# Patient Record
Sex: Male | Born: 1940 | Race: White | Hispanic: No | Marital: Married | Smoking: Former smoker
Health system: Southern US, Community
[De-identification: ages and names within clinical notes are randomized; demographics above are authoritative.]

## PROBLEM LIST (undated history)

## (undated) DIAGNOSIS — H538 Other visual disturbances: Secondary | ICD-10-CM

## (undated) DIAGNOSIS — L309 Dermatitis, unspecified: Secondary | ICD-10-CM

## (undated) DIAGNOSIS — M549 Dorsalgia, unspecified: Secondary | ICD-10-CM

## (undated) DIAGNOSIS — K5792 Diverticulitis of intestine, part unspecified, without perforation or abscess without bleeding: Secondary | ICD-10-CM

## (undated) DIAGNOSIS — F419 Anxiety disorder, unspecified: Secondary | ICD-10-CM

## (undated) DIAGNOSIS — N400 Enlarged prostate without lower urinary tract symptoms: Secondary | ICD-10-CM

## (undated) DIAGNOSIS — R55 Syncope and collapse: Secondary | ICD-10-CM

## (undated) DIAGNOSIS — M1990 Unspecified osteoarthritis, unspecified site: Secondary | ICD-10-CM

## (undated) DIAGNOSIS — E785 Hyperlipidemia, unspecified: Secondary | ICD-10-CM

## (undated) DIAGNOSIS — I8393 Asymptomatic varicose veins of bilateral lower extremities: Secondary | ICD-10-CM

## (undated) DIAGNOSIS — I10 Essential (primary) hypertension: Secondary | ICD-10-CM

## (undated) DIAGNOSIS — M79606 Pain in leg, unspecified: Secondary | ICD-10-CM

## (undated) DIAGNOSIS — M4846XA Fatigue fracture of vertebra, lumbar region, initial encounter for fracture: Secondary | ICD-10-CM

## (undated) DIAGNOSIS — R51 Headache: Secondary | ICD-10-CM

## (undated) DIAGNOSIS — G8929 Other chronic pain: Secondary | ICD-10-CM

## (undated) DIAGNOSIS — R519 Headache, unspecified: Secondary | ICD-10-CM

## (undated) DIAGNOSIS — F5105 Insomnia due to other mental disorder: Secondary | ICD-10-CM

## (undated) HISTORY — DX: Syncope and collapse: R55

## (undated) HISTORY — DX: Unspecified osteoarthritis, unspecified site: M19.90

## (undated) HISTORY — PX: OTHER SURGICAL HISTORY: SHX169

## (undated) HISTORY — DX: Diverticulitis of intestine, part unspecified, without perforation or abscess without bleeding: K57.92

## (undated) HISTORY — DX: Essential (primary) hypertension: I10

## (undated) HISTORY — DX: Pain in leg, unspecified: M79.606

## (undated) HISTORY — DX: Anxiety disorder, unspecified: F41.9

## (undated) HISTORY — DX: Other visual disturbances: H53.8

## (undated) HISTORY — DX: Headache: R51

## (undated) HISTORY — DX: Dermatitis, unspecified: L30.9

## (undated) HISTORY — DX: Benign prostatic hyperplasia without lower urinary tract symptoms: N40.0

## (undated) HISTORY — DX: Asymptomatic varicose veins of bilateral lower extremities: I83.93

## (undated) HISTORY — DX: Insomnia due to other mental disorder: F51.05

## (undated) HISTORY — DX: Hyperlipidemia, unspecified: E78.5

## (undated) HISTORY — DX: Headache, unspecified: R51.9

---

## 2002-07-31 ENCOUNTER — Encounter: Payer: Self-pay | Admitting: Family Medicine

## 2002-07-31 ENCOUNTER — Ambulatory Visit (HOSPITAL_COMMUNITY): Admission: RE | Admit: 2002-07-31 | Discharge: 2002-07-31 | Payer: Self-pay | Admitting: Family Medicine

## 2005-03-05 ENCOUNTER — Encounter: Admission: RE | Admit: 2005-03-05 | Discharge: 2005-03-05 | Payer: Self-pay | Admitting: Pulmonary Disease

## 2005-10-14 ENCOUNTER — Ambulatory Visit (HOSPITAL_COMMUNITY): Admission: RE | Admit: 2005-10-14 | Discharge: 2005-10-14 | Payer: Self-pay | Admitting: Pulmonary Disease

## 2007-03-29 ENCOUNTER — Ambulatory Visit (HOSPITAL_COMMUNITY): Admission: RE | Admit: 2007-03-29 | Discharge: 2007-03-29 | Payer: Self-pay | Admitting: Pulmonary Disease

## 2007-03-31 ENCOUNTER — Ambulatory Visit: Payer: Self-pay | Admitting: Internal Medicine

## 2008-01-03 ENCOUNTER — Ambulatory Visit (HOSPITAL_COMMUNITY): Admission: RE | Admit: 2008-01-03 | Discharge: 2008-01-03 | Payer: Self-pay | Admitting: Pulmonary Disease

## 2009-03-10 LAB — HM COLONOSCOPY

## 2010-12-06 ENCOUNTER — Encounter: Payer: Self-pay | Admitting: Family Medicine

## 2011-02-26 ENCOUNTER — Encounter: Payer: Self-pay | Admitting: Physician Assistant

## 2011-02-26 DIAGNOSIS — E119 Type 2 diabetes mellitus without complications: Secondary | ICD-10-CM

## 2011-02-26 DIAGNOSIS — G4700 Insomnia, unspecified: Secondary | ICD-10-CM

## 2011-02-26 DIAGNOSIS — I10 Essential (primary) hypertension: Secondary | ICD-10-CM

## 2011-02-26 DIAGNOSIS — F32A Depression, unspecified: Secondary | ICD-10-CM

## 2011-02-26 DIAGNOSIS — F329 Major depressive disorder, single episode, unspecified: Secondary | ICD-10-CM | POA: Insufficient documentation

## 2011-02-26 DIAGNOSIS — J309 Allergic rhinitis, unspecified: Secondary | ICD-10-CM

## 2011-02-26 DIAGNOSIS — N400 Enlarged prostate without lower urinary tract symptoms: Secondary | ICD-10-CM

## 2011-02-26 DIAGNOSIS — F419 Anxiety disorder, unspecified: Secondary | ICD-10-CM | POA: Insufficient documentation

## 2011-03-30 NOTE — H&P (Signed)
NAME:  Omar Porter, Omar Porter                 ACCOUNT NO.:  [id number]   MEDICAL RECORD NO.:  [id number]          PATIENT TYPE:  AMB   LOCATION:  DAY                           FACILITY:  [facility]   PHYSICIAN:  Lionel December, M.D.    DATE OF BIRTH:  [birth year]   DATE OF ADMISSION:  DATE OF DISCHARGE:  LH                              HISTORY & PHYSICAL   REASON FOR CONSULTATION:  Diarrhea.   HISTORY OF PRESENT ILLNESS:  Omar Porter is a 70 year old male who tells me  about 2-3 weeks ago he had a couple of day history of loose, watery  diarrhea, about four stools a day, along with some left lower quadrant  pain.  He tells me he has sporadic diarrhea, every 3-4 months he will  develop some intermittent abdominal pain and diarrhea.  Four to five  months ago, he was even awakened in the middle of the night.  Most of  his symptoms do appear to be nocturnal.  He denies any rectal bleeding  or melena.  Denies any fever, does have some chills.  Denies any nausea  or vomiting.  Weight has remained stable.  Denies any sensitivity to  milk products.  Denies any ill family contacts.  Denies any foreign  travel, new pets or new medications.  Denies any heartburn, indigestion,  dysphagia, odynophagia, anorexia or early satiety.   MEDICAL HISTORY:  Hypertension.  He has BPH and is followed by Dr.  Jerre Simon.  He has had diverticulitis on a couple of occasions, but tells  me he does not think he has had a complete colonoscopy.   CURRENT MEDICATIONS:  1. Metoprolol 50 mg b.i.d.  2. Doxazosin 4 mg daily.  3. Tylenol p.r.n.  4. Ibuprofen p.r.n.   ALLERGIES:  NO KNOWN DRUG ALLERGIES.   FAMILY HISTORY:  There is no known family history of colorectal  carcinoma or liver or chronic GI problems.   SOCIAL HISTORY:  Omar Porter has been married for 11 years.  He has two  grown healthy children.  He works at [employer] full time.  He denies  any tobacco, alcohol or drug use.   REVIEW OF SYSTEMS:  CONSTITUTIONAL:  Denies  any fatigue.  CARDIOVASCULAR:  Denies any chest pain or palpitations.  PULMONARY:  Denies shortness of breath, dyspnea, cough, hemoptysis.  GI: See HPI.   PHYSICAL EXAMINATION:  VITAL SIGNS:  Weight 219 pounds, height 71  inches, temperature 97.1, blood pressure 140/98 and pulse 56.  GENERAL:  Omar Porter is a well-developed, well-nourished Caucasian male in  no acute distress.  HEENT:  Clear, nonicteric.  Conjunctivae are pink.  Oropharynx pink and  moist without lesions.  NECK:  Supple without any mass or thyromegaly.  CHEST:  Heart regular rate and rhythm, normal S1, S2 without any  murmurs, clicks, rubs or gallops.  LUNGS:  Clear to auscultation bilaterally.  ABDOMEN:  Positive bowel sounds x4.  No bruits auscultated.  Soft,  nontender, nondistended without palpable mass or hepatosplenomegaly.  No  rebound or guarding.  EXTREMITIES:  Without clubbing or edema bilaterally.  SKIN:  Pink, warm and dry without rash, jaundice.   IMPRESSION:  Omar Porter is a 70 year old Caucasian male with intermittent  history of nocturnal diarrhea.  His symptoms are definitely concerning,  and he needs to have colonoscopy to rule out colorectal carcinoma and  also, not mentioned above,  he did have a CT of the abdomen and pelvis  with contrast on Mar 29, 2007.  He was found to have colonic  diverticulosis without evidence of diverticulitis and mental prostate  gland enlargement, few small pelvic phleboliths, nonobstructing left  renal calculi and a tiny cyst.  His symptoms are not typical for  inflammatory bowel disease, although, he could have an element of IBS.  Again, the nocturnal diarrhea is definitely concerning   PLAN:  Colonoscopy with Dr. Karilyn Cota in he near future.  I have discussed  the procedure including risks and benefits, which include but are not  limited to bleeding, infection, perforation, drug reaction.  He agrees  with plan and consent will be obtained.   We would like to thank Dr.  Juanetta Gosling for allowing [place] to participate in the  care of Omar Porter.      Nicholas Lose, N.P.      Lionel December, M.D.  Electronically Signed    KC/MEDQ  D:  03/31/2007  T:  03/31/2007  Job:  [id number]   cc:   Ramon Dredge L. Juanetta Gosling, M.D.  Fax: [phone]

## 2011-04-19 ENCOUNTER — Encounter: Payer: Self-pay | Admitting: Physician Assistant

## 2012-11-01 ENCOUNTER — Encounter: Payer: Self-pay | Admitting: Cardiology

## 2012-11-01 ENCOUNTER — Ambulatory Visit (INDEPENDENT_AMBULATORY_CARE_PROVIDER_SITE_OTHER): Payer: Medicare Other | Admitting: Cardiology

## 2012-11-01 VITALS — BP 140/80 | HR 101 | Ht 70.0 in | Wt 202.0 lb

## 2012-11-01 DIAGNOSIS — R609 Edema, unspecified: Secondary | ICD-10-CM

## 2012-11-01 DIAGNOSIS — I10 Essential (primary) hypertension: Secondary | ICD-10-CM

## 2012-11-01 MED ORDER — AMLODIPINE BESYLATE 5 MG PO TABS: 5.0000 mg | ORAL_TABLET | Freq: Every day | ORAL | Status: DC

## 2012-11-01 NOTE — Progress Notes (Signed)
HPI The patient presents for evaluation of edema. He has no prior cardiac history other than an ultrasound some years ago. He does have cardiovascular risk factors as described. He was noted recently to have lower extremity swelling. He has had this in the past but this was much worse than usual. Chest x-ray was normal. BNP was 22. He was treated with diuretics with improvement and referred here. His swelling is much improved. He is not describing any shortness of breath, PND or orthopnea. He is not describing any palpitations, presyncope or syncope. He has had some chest discomfort. However, this he has only after he eats. It lasts for about 15 minutes. Taking a drink helps. Moving around helps. Moving his arms in a certain direction helps.  He is somewhat limited by arthritis but with his usual activities he cannot bring this discomfort on. He describes at its peak as 5/10 in under his left breast without radiation.   No Known Allergies  Current Outpatient Prescriptions  Medication Sig Dispense Refill  . amLODipine (NORVASC) 5 MG tablet Take 1 tablet (5 mg total) by mouth daily.  30 tablet  11  . atorvastatin (LIPITOR) 80 MG tablet Take 80 mg by mouth daily.      . clotrimazole-betamethasone (LOTRISONE) cream Apply topically 2 (two) times daily.        . diclofenac sodium (VOLTAREN) 1 % GEL Apply topically 4 (four) times daily.      Marland Kitchen esomeprazole (NEXIUM) 40 MG capsule Take 40 mg by mouth daily before breakfast.      . furosemide (LASIX) 40 MG tablet Take 40 mg by mouth 2 (two) times daily.      Marland Kitchen glyBURIDE (DIABETA) 5 MG tablet Take 5 mg by mouth daily with breakfast.      . lisinopril (PRINIVIL,ZESTRIL) 20 MG tablet Take 20 mg by mouth 2 (two) times daily.        . montelukast (SINGULAIR) 10 MG tablet Take 10 mg by mouth at bedtime.        . sitaGLIPtan-metformin (JANUMET) 50-1000 MG per tablet Take 1 tablet by mouth 2 (two) times daily with a meal.      . terazosin (HYTRIN) 10 MG capsule  Take 10 mg by mouth 2 (two) times daily.        Marland Kitchen zolpidem (AMBIEN) 10 MG tablet Take 10 mg by mouth at bedtime as needed.          Past Medical History  Diagnosis Date  . Varicose veins of legs     right   . Blurred vision   . Diabetes mellitus   . Severe headache   . Syncopal episodes     with vision change   . Leg pain   . Hyperlipidemia   . Allergic rhinitis   . Arthritis   . Diverticulitis   . BPH (benign prostatic hyperplasia)   . Hypertension   . Hyposomnia, insomnia, or sleeplessness associated with anxiety   . Dermatitis     Past Surgical History  Procedure Date  . None     Family History  Problem Relation Age of Onset  . CAD Father     History   Social History  . Marital Status: Married    Spouse Name: N/A    Number of Children: 2  . Years of Education: N/A   Occupational History  . Not on file.   Social History Main Topics  . Smoking status: Former Smoker    Quit date:  11/01/1972  . Smokeless tobacco: Not on file  . Alcohol Use: Not on file  . Drug Use: Not on file  . Sexually Active: Not on file   Other Topics Concern  . Not on file   Social History Narrative   Lives at home with wife.      ROS:  Positive for joint pains in the legs and back, headaches and dizziness, dry cough, reflux.Marland Kitchentoher     PHYSICAL EXAM BP 140/80  Pulse 101  Ht 5\' 10"  (1.778 m)  Wt 202 lb (91.627 kg)  BMI 28.98 kg/m2 GENERAL:  Well appearing HEENT:  Pupils equal round and reactive, fundi not visualized, oral mucosa unremarkable, poor dentition NECK:  No jugular venous distention, waveform within normal limits, carotid upstroke brisk and symmetric, no bruits, no thyromegaly LYMPHATICS:  No cervical, inguinal adenopathy LUNGS:  Clear to auscultation bilaterally BACK:  No CVA tenderness CHEST:  Unremarkable HEART:  PMI not displaced or sustained,S1 and S2 within normal limits, no S3, no S4, no clicks, no rubs, no murmurs ABD:  Flat, positive bowel sounds  normal in frequency in pitch, no bruits, no rebound, no guarding, no midline pulsatile mass, no hepatomegaly, no splenomegaly EXT:  2 plus pulses throughout, moderate bilateral lower extremity edema, no cyanosis no clubbing SKIN:  No rashes no nodules NEURO:  Cranial nerves II through XII grossly intact, motor grossly intact throughout PSYCH:  Cognitively intact, oriented to person place and time   EKG:  Sinus tachycardia, rate 101, left axis deviation, low voltage in limb leads, poor anterior R wave progression, no acute ST-T wave changes. 11/01/2012  ASSESSMENT AND PLAN  EDEMA I suspect that this is to some degree related to his Norvasc and I will reduce the dose to 5 mg. He will continue current dose of diuretic. I will be checking an echocardiogram to further evaluate pulmonary pressures although his BNP was normal.  TACHYCARDIA I will start with the echocardiogram. Given his risk factors I would have a low threshold for stress testing. Unfortunately with joint problems he would not be able to walk on a treadmill.  HTN He will keep an eye on his blood pressure. If his pressure goes up with the reduced dose of Norvasc I will likely start spironolactone.

## 2012-11-01 NOTE — Patient Instructions (Addendum)
Your physician has requested that you have an echocardiogram. Echocardiography is a painless test that uses sound waves to create images of your heart. It provides your doctor with information about the size and shape of your heart and how well your heart's chambers and valves are working. This procedure takes approximately one hour. There are no restrictions for this procedure.  This will be done in the [facility] office on 11/16/12 at 12:00.  Your physician has recommended you make the following change in your medication: DECREASE Amlodipine to 5mg  daily  Your physician recommends that you schedule a follow-up appointment in: 1 MONTH with Dr Antoine Poche

## 2012-11-16 ENCOUNTER — Telehealth: Payer: Self-pay | Admitting: Cardiology

## 2012-11-16 ENCOUNTER — Other Ambulatory Visit: Payer: Self-pay

## 2012-11-16 DIAGNOSIS — R0989 Other specified symptoms and signs involving the circulatory and respiratory systems: Secondary | ICD-10-CM

## 2012-11-16 NOTE — Telephone Encounter (Signed)
Called and left message asking patient to call office regarding his NO Show for Echo.

## 2012-11-20 ENCOUNTER — Other Ambulatory Visit: Payer: Self-pay | Admitting: *Deleted

## 2012-11-20 DIAGNOSIS — I10 Essential (primary) hypertension: Secondary | ICD-10-CM

## 2012-11-20 DIAGNOSIS — R609 Edema, unspecified: Secondary | ICD-10-CM

## 2012-11-24 ENCOUNTER — Other Ambulatory Visit: Payer: Self-pay | Admitting: *Deleted

## 2012-11-24 DIAGNOSIS — R609 Edema, unspecified: Secondary | ICD-10-CM

## 2012-11-24 DIAGNOSIS — I10 Essential (primary) hypertension: Secondary | ICD-10-CM

## 2012-11-30 ENCOUNTER — Other Ambulatory Visit: Payer: Medicare Other

## 2012-11-30 ENCOUNTER — Other Ambulatory Visit: Payer: Self-pay

## 2012-11-30 ENCOUNTER — Other Ambulatory Visit (INDEPENDENT_AMBULATORY_CARE_PROVIDER_SITE_OTHER): Payer: Medicare Other

## 2012-11-30 DIAGNOSIS — I10 Essential (primary) hypertension: Secondary | ICD-10-CM

## 2012-11-30 DIAGNOSIS — R609 Edema, unspecified: Secondary | ICD-10-CM

## 2012-11-30 DIAGNOSIS — R072 Precordial pain: Secondary | ICD-10-CM

## 2012-12-06 ENCOUNTER — Encounter: Payer: Self-pay | Admitting: Cardiology

## 2012-12-06 ENCOUNTER — Ambulatory Visit (INDEPENDENT_AMBULATORY_CARE_PROVIDER_SITE_OTHER): Payer: 59 | Admitting: Cardiology

## 2012-12-06 ENCOUNTER — Telehealth: Payer: Self-pay | Admitting: *Deleted

## 2012-12-06 VITALS — BP 130/80 | HR 80 | Ht 69.0 in | Wt 208.0 lb

## 2012-12-06 DIAGNOSIS — R609 Edema, unspecified: Secondary | ICD-10-CM

## 2012-12-06 DIAGNOSIS — E119 Type 2 diabetes mellitus without complications: Secondary | ICD-10-CM

## 2012-12-06 DIAGNOSIS — I10 Essential (primary) hypertension: Secondary | ICD-10-CM

## 2012-12-06 MED ORDER — SPIRONOLACTONE 25 MG PO TABS: 25.0000 mg | ORAL_TABLET | Freq: Every day | ORAL | Status: DC

## 2012-12-06 NOTE — Telephone Encounter (Signed)
Patient informed. 

## 2012-12-06 NOTE — Patient Instructions (Addendum)
Please stop Norvasc (amlodipine) and start Spironolactone 25 mg a day. Continue all other medications as listed  Please have blood work at [facility]. (TSH and BMP).  Follow up with Dr Antoine Poche in 6 weeks.

## 2012-12-06 NOTE — Telephone Encounter (Signed)
Message copied by Eustace Moore on Wed Dec 06, 2012 10:03 AM ------      Message from: Rollene Rotunda      Created: Sun Dec 03, 2012  8:01 PM       Echo is OK.  No obvious etiology of his edema.  I would like to see him back in follow up to consider whether I want to order a stress test or not.  Call Mr. Maffett with the results.

## 2012-12-06 NOTE — Progress Notes (Signed)
HPI The patient presents for evaluation of edema. At the last appointment I thought this was likely related to Norvasc and I reduced it from 10-5 mg.  I did check an echocardiogram and there was no evidence of pulmonary hypertension. He did have some moderate concentric left ventricular hypertrophy however. His lower extremity edema is essentially unchanged from previous. He denies any new cardiovascular symptoms and continues to deny shortness of breath, PND or orthopnea. He does have a rapid resting heart rate. at that resting heart rate which he thinks he's always had. He doesn't feel any palpitations, presyncope or syncope. He has some atypical chest discomfort as previously described.   No Known Allergies  Current Outpatient Prescriptions  Medication Sig Dispense Refill  . ALPRAZolam (XANAX) 0.5 MG tablet       . amLODipine (NORVASC) 5 MG tablet Take 1 tablet (5 mg total) by mouth daily.  30 tablet  11  . atorvastatin (LIPITOR) 80 MG tablet Take 80 mg by mouth daily.      . clotrimazole-betamethasone (LOTRISONE) cream Apply topically 2 (two) times daily.        . diclofenac sodium (VOLTAREN) 1 % GEL Apply topically 4 (four) times daily.      Marland Kitchen esomeprazole (NEXIUM) 40 MG capsule Take 40 mg by mouth daily before breakfast.      . furosemide (LASIX) 40 MG tablet Take 40 mg by mouth 2 (two) times daily.      Marland Kitchen glimepiride (AMARYL) 4 MG tablet       . lisinopril (PRINIVIL,ZESTRIL) 20 MG tablet Take 20 mg by mouth 2 (two) times daily.        . montelukast (SINGULAIR) 10 MG tablet Take 10 mg by mouth at bedtime.        . sitaGLIPtan-metformin (JANUMET) 50-1000 MG per tablet Take 1 tablet by mouth 2 (two) times daily with a meal.      . terazosin (HYTRIN) 10 MG capsule Take 10 mg by mouth 2 (two) times daily.          Past Medical History  Diagnosis Date  . Varicose veins of legs     right   . Blurred vision   . Diabetes mellitus   . Severe headache   . Syncopal episodes     with  vision change   . Leg pain   . Hyperlipidemia   . Allergic rhinitis   . Arthritis   . Diverticulitis   . BPH (benign prostatic hyperplasia)   . Hypertension   . Hyposomnia, insomnia, or sleeplessness associated with anxiety   . Dermatitis     Past Surgical History  Procedure Date  . None     ROS:  As stated in the HPI and negative for all other systems.      PHYSICAL EXAM BP 130/80  Pulse 80  Ht 5\' 9"  (1.753 m)  Wt 208 lb (94.348 kg)  BMI 30.72 kg/m2 GENERAL:  Well appearing NECK:  No jugular venous distention, waveform within normal limits, carotid upstroke brisk and symmetric, no bruits, no thyromegaly LUNGS:  Clear to auscultation bilaterally BACK:  No CVA tenderness CHEST:  Unremarkable HEART:  PMI not displaced or sustained,S1 and S2 within normal limits, no S3, no S4, no clicks, no rubs, no murmurs ABD:  Flat, positive bowel sounds normal in frequency in pitch, no bruits, no rebound, no guarding, no midline pulsatile mass, no hepatomegaly, no splenomegaly EXT:  2 plus pulses throughout, moderate bilateral lower extremity edema, no  cyanosis no clubbing    EKG:  Sinus tachycardia, rate 101, left axis deviation, low voltage in limb leads, poor anterior R wave progression, no acute ST-T wave changes. 12/06/2012  ASSESSMENT AND PLAN  EDEMA I still suspect this might be related to some degree to the Norvasc. I will discontinue this completely. Management of his blood pressure will be as below.  TACHYCARDIA I looked again through his chart I didn't see a recent TSH. I will order this. We did discuss the possibility of this being related to PTSD been back to his time diffusing [place] in [place].  HTN I will spironolactone 25 mg daily and check a basic metabolic profile in 7 days.

## 2013-01-17 ENCOUNTER — Ambulatory Visit: Payer: Medicare Other | Admitting: Cardiology

## 2013-01-30 ENCOUNTER — Other Ambulatory Visit: Payer: Self-pay | Admitting: *Deleted

## 2013-01-30 MED ORDER — TERAZOSIN HCL 10 MG PO CAPS: 10.0000 mg | ORAL_CAPSULE | Freq: Two times a day (BID) | ORAL | Status: DC

## 2013-02-06 ENCOUNTER — Encounter: Payer: Self-pay | Admitting: Physician Assistant

## 2013-02-06 ENCOUNTER — Ambulatory Visit (INDEPENDENT_AMBULATORY_CARE_PROVIDER_SITE_OTHER): Payer: Medicare HMO | Admitting: Physician Assistant

## 2013-02-06 VITALS — BP 163/87 | HR 114 | Temp 96.4°F | Ht 69.0 in | Wt 204.4 lb

## 2013-02-06 DIAGNOSIS — F05 Delirium due to known physiological condition: Secondary | ICD-10-CM

## 2013-02-06 DIAGNOSIS — R972 Elevated prostate specific antigen [PSA]: Secondary | ICD-10-CM

## 2013-02-06 DIAGNOSIS — I10 Essential (primary) hypertension: Secondary | ICD-10-CM

## 2013-02-06 LAB — POCT URINALYSIS DIPSTICK
Bilirubin, UA: NEGATIVE
Blood, UA: NEGATIVE
Glucose, UA: NEGATIVE
Ketones, UA: NEGATIVE
Nitrite, UA: NEGATIVE
Protein, UA: NEGATIVE
Spec Grav, UA: 1.015
Urobilinogen, UA: NEGATIVE

## 2013-02-06 LAB — COMPREHENSIVE METABOLIC PANEL
ALT: 24 U/L (ref 0–53)
AST: 18 U/L (ref 0–37)
Albumin: 4.2 g/dL (ref 3.5–5.2)
Alkaline Phosphatase: 105 U/L (ref 39–117)
BUN: 8 mg/dL (ref 6–23)
CO2: 22 mEq/L (ref 19–32)
Calcium: 9.5 mg/dL (ref 8.4–10.5)
Glucose, Bld: 191 mg/dL — ABNORMAL HIGH (ref 70–99)
Potassium: 3.6 mEq/L (ref 3.5–5.3)
Sodium: 141 mEq/L (ref 135–145)
Total Bilirubin: 0.5 mg/dL (ref 0.3–1.2)
Total Protein: 6.5 g/dL (ref 6.0–8.3)

## 2013-02-06 LAB — POCT CBC
Granulocyte percent: 75.2 %G (ref 37–80)
HCT, POC: 50.6 % (ref 43.5–53.7)
Lymph, poc: 2.3 (ref 0.6–3.4)
MCH, POC: 29.1 pg (ref 27–31.2)
MPV: 7.5 fL (ref 0–99.8)
POC Granulocyte: 7.6 — AB (ref 2–6.9)
POC LYMPH PERCENT: 23.1 %L (ref 10–50)
Platelet Count, POC: 247 10*3/uL (ref 142–424)
RBC: 5.7 M/uL (ref 4.69–6.13)
RDW, POC: 13.6 %
WBC: 10.1 10*3/uL (ref 4.6–10.2)

## 2013-02-06 MED ORDER — METOPROLOL TARTRATE 25 MG PO TABS: 25.0000 mg | ORAL_TABLET | Freq: Two times a day (BID) | ORAL | Status: DC

## 2013-02-06 NOTE — Progress Notes (Signed)
  Subjective:    Patient ID: Omar Porter, male    DOB: 10-26-1941, 72 y.o.   MRN: [id number]  HPI Accompanied by wife; having episodes of elevated B/P and confusion; generally occurring in late evening or early morning    Review of Systems  All other systems reviewed and are negative.       Objective:   Physical Exam  Constitutional: He is oriented to person, place, and time.  HENT:  Head: Normocephalic and atraumatic.  Right Ear: External ear normal.  Left Ear: External ear normal.  Nose: Nose normal.  Mouth/Throat: Oropharynx is clear and moist.  Poor dentition  Eyes: Conjunctivae and EOM are normal. Pupils are equal, round, and reactive to light.  Neck: Normal range of motion. Neck supple.  Cardiovascular: Normal heart sounds.  Exam reveals no gallop.   No murmur heard. Pulmonary/Chest: Effort normal and breath sounds normal.  Abdominal: Soft. Bowel sounds are normal.  Musculoskeletal:  Ambulates with cane  Neurological: He is alert and oriented to person, place, and time.  Skin: Skin is warm and dry.  Psychiatric: He has a normal mood and affect. His behavior is normal. Judgment and thought content normal.          Assessment & Plan:  Elevated B/P Confusion Orders Placed This Encounter  Procedures  . Comprehensive metabolic panel  . Ambulatory referral to Urology    Referral Priority:  Routine    Referral Type:  Consultation    Referral Reason:  Specialty Services Required    Requested Specialty:  Urology    Number of Visits Requested:  1  . POCT CBC  . POCT urinalysis dipstick   Meds ordered this encounter  Medications  . HYDROcodone-acetaminophen (NORCO/VICODIN) 5-325 MG per tablet    Sig: Take 5-325 tablets by mouth.  . metoprolol tartrate (LOPRESSOR) 25 MG tablet    Sig: Take 1 tablet (25 mg total) by mouth 2 (two) times daily.    Dispense:  60 tablet    Refill:  3    Order Specific Question:  Supervising Provider    Answer:  Ernestina Penna  [phone]

## 2013-02-08 ENCOUNTER — Other Ambulatory Visit: Payer: Self-pay

## 2013-02-08 MED ORDER — ATORVASTATIN CALCIUM 40 MG PO TABS: 40.0000 mg | ORAL_TABLET | Freq: Every day | ORAL | Status: DC

## 2013-02-12 ENCOUNTER — Other Ambulatory Visit: Payer: Self-pay | Admitting: *Deleted

## 2013-02-12 ENCOUNTER — Other Ambulatory Visit: Payer: Self-pay

## 2013-02-12 MED ORDER — TERAZOSIN HCL 10 MG PO CAPS: 10.0000 mg | ORAL_CAPSULE | Freq: Two times a day (BID) | ORAL | Status: DC

## 2013-02-12 MED ORDER — CLOTRIMAZOLE-BETAMETHASONE 1-0.05 % EX CREA: TOPICAL_CREAM | Freq: Two times a day (BID) | CUTANEOUS | Status: DC

## 2013-02-12 NOTE — Telephone Encounter (Signed)
Pt aware RX for Hytrin ready for him here at office.

## 2013-02-14 ENCOUNTER — Ambulatory Visit (INDEPENDENT_AMBULATORY_CARE_PROVIDER_SITE_OTHER): Payer: 59 | Admitting: Cardiology

## 2013-02-14 ENCOUNTER — Encounter: Payer: Self-pay | Admitting: Cardiology

## 2013-02-14 VITALS — BP 160/91 | HR 70 | Wt 201.0 lb

## 2013-02-14 DIAGNOSIS — I10 Essential (primary) hypertension: Secondary | ICD-10-CM

## 2013-02-14 DIAGNOSIS — E119 Type 2 diabetes mellitus without complications: Secondary | ICD-10-CM

## 2013-02-14 MED ORDER — AMLODIPINE BESYLATE 10 MG PO TABS: 10.0000 mg | ORAL_TABLET | Freq: Every day | ORAL | Status: AC

## 2013-02-14 NOTE — Patient Instructions (Addendum)
Please stop your spironolactone  Increase Amlodipine to 10 mg a day. Continue all other medications as listed  Follow up in one month with Dr Antoine Poche

## 2013-02-14 NOTE — Progress Notes (Signed)
HPI The patient presents for evaluation of edema.  I thought this was likely related to Norvasc and I reduced this from 10-5 mg in the past.  There was no change in the edema so I stopped it completely at the last visit.  I did check an echocardiogram and there was no evidence of pulmonary hypertension. He did have some moderate concentric left ventricular hypertrophy however.  His lower extremity edema is essentially unchanged from previous. However, his blood pressure is more poorly controlled off of Norvasc despite the addition of spironolactone. He denies any new cardiovascular symptoms otherwise.The patient denies any new symptoms such as chest discomfort, neck or arm discomfort. There has been no new shortness of breath, PND or orthopnea. There have been no reported palpitations, presyncope or syncope.  He does keep his feet down and has not been wearing compression stockings.  Allergies  Allergen Reactions  . Sulfa Antibiotics Diarrhea    Current Outpatient Prescriptions  Medication Sig Dispense Refill  . ALPRAZolam (XANAX) 0.5 MG tablet       . amLODipine (NORVASC) 5 MG tablet Take 1 tablet by mouth daily.      Marland Kitchen atorvastatin (LIPITOR) 40 MG tablet Take 1 tablet (40 mg total) by mouth daily.  30 tablet  4  . clotrimazole-betamethasone (LOTRISONE) cream Apply topically 2 (two) times daily.  30 g  1  . Desoximetasone (TOPICORT) 0.25 % ointment       . diclofenac sodium (VOLTAREN) 1 % GEL Apply topically 4 (four) times daily.      Marland Kitchen esomeprazole (NEXIUM) 40 MG capsule Take 40 mg by mouth daily before breakfast.      . furosemide (LASIX) 40 MG tablet Take 40 mg by mouth 2 (two) times daily.      Marland Kitchen glimepiride (AMARYL) 4 MG tablet       . glyBURIDE (DIABETA) 5 MG tablet Take 1 tablet by mouth daily.      Marland Kitchen HYDROcodone-acetaminophen (NORCO/VICODIN) 5-325 MG per tablet Take 5-325 tablets by mouth.      Marland Kitchen lisinopril (PRINIVIL,ZESTRIL) 20 MG tablet Take 20 mg by mouth 2 (two) times daily.         . metoprolol tartrate (LOPRESSOR) 25 MG tablet Take 1 tablet (25 mg total) by mouth 2 (two) times daily.  60 tablet  3  . montelukast (SINGULAIR) 10 MG tablet Take 10 mg by mouth at bedtime.        . sitaGLIPtan-metformin (JANUMET) 50-1000 MG per tablet Take 1 tablet by mouth 2 (two) times daily with a meal.      . spironolactone (ALDACTONE) 25 MG tablet Take 1 tablet (25 mg total) by mouth daily.  30 tablet  11  . terazosin (HYTRIN) 10 MG capsule Take 1 capsule (10 mg total) by mouth 2 (two) times daily.  60 capsule  4  . zolpidem (AMBIEN) 10 MG tablet Take 10 mg by mouth at bedtime as needed for sleep.       No current facility-administered medications for this visit.    Past Medical History  Diagnosis Date  . Varicose veins of legs     right   . Blurred vision   . Diabetes mellitus   . Severe headache   . Syncopal episodes     with vision change   . Leg pain   . Hyperlipidemia   . Allergic rhinitis   . Arthritis   . Diverticulitis   . BPH (benign prostatic hyperplasia)   . Hypertension   .  Hyposomnia, insomnia, or sleeplessness associated with anxiety   . Dermatitis     Past Surgical History  Procedure Laterality Date  . None      ROS:  As stated in the HPI and negative for all other systems.  PHYSICAL EXAM BP 160/91  Pulse 70  Wt 201 lb (91.173 kg)  BMI 29.67 kg/m2 GENERAL:  Well appearing NECK:  No jugular venous distention, waveform within normal limits, carotid upstroke brisk and symmetric, no bruits, no thyromegaly LUNGS:  Clear to auscultation bilaterally BACK:  No CVA tenderness CHEST:  Unremarkable HEART:  PMI not displaced or sustained,S1 and S2 within normal limits, no S3, no S4, no clicks, no rubs, no murmurs ABD:  Flat, positive bowel sounds normal in frequency in pitch, no bruits, no rebound, no guarding, no midline pulsatile mass, no hepatomegaly, no splenomegaly EXT:  2 plus pulses throughout, moderate bilateral lower extremity edema, no  cyanosis no clubbing  EKG:  Sinus tachycardia, rate 70, left axis deviation, low voltage in limb leads, poor anterior R wave progression, no acute ST-T wave changes. 02/14/2013  ASSESSMENT AND PLAN  EDEMA It did not get better off of the Norvasc.  He will start to keep the feet elevated and start to wear the compression stockings.   TACHYCARDIA This is improved on beta blocker added recently so he will continue this.   HTN The blood pressure is still not well controlled. Since his edema is not better and his blood pressure was controlled on Norvasc. I will stop the spironolactone and reinitiate 10 mg of Norvasc.

## 2013-02-19 ENCOUNTER — Other Ambulatory Visit: Payer: Self-pay | Admitting: Pharmacist

## 2013-02-19 DIAGNOSIS — E119 Type 2 diabetes mellitus without complications: Secondary | ICD-10-CM

## 2013-02-19 MED ORDER — ACCU-CHEK NANO SMARTVIEW W/DEVICE KIT: 1.0000 | PACK | Status: DC

## 2013-02-19 MED ORDER — ACCU-CHEK FASTCLIX LANCETS MISC: 1.0000 | Freq: Two times a day (BID) | Status: DC

## 2013-02-19 MED ORDER — GLUCOSE BLOOD VI STRP: ORAL_STRIP | Status: DC

## 2013-02-26 ENCOUNTER — Other Ambulatory Visit: Payer: Self-pay | Admitting: Family Medicine

## 2013-02-27 ENCOUNTER — Other Ambulatory Visit: Payer: Self-pay | Admitting: Family Medicine

## 2013-03-01 ENCOUNTER — Telehealth: Payer: Self-pay | Admitting: Family Medicine

## 2013-03-01 NOTE — Telephone Encounter (Signed)
PT WAS OFFERED APPT FOR 4/25 WITH [facility] AND [facility] AND REFUSED BOTH

## 2013-03-08 ENCOUNTER — Other Ambulatory Visit: Payer: Self-pay | Admitting: *Deleted

## 2013-03-08 MED ORDER — GLIMEPIRIDE 4 MG PO TABS: ORAL_TABLET | ORAL | Status: DC

## 2013-03-08 MED ORDER — CLOTRIMAZOLE-BETAMETHASONE 1-0.05 % EX CREA: TOPICAL_CREAM | Freq: Two times a day (BID) | CUTANEOUS | Status: DC

## 2013-03-08 NOTE — Telephone Encounter (Signed)
LAST REFILL 01/13/13. LAST OV 01/12/13. LAST AIC 12/19/12 7.0. SEE ALLERGIES OF SULFA TO GLIMEPIRIDE

## 2013-03-13 ENCOUNTER — Ambulatory Visit (INDEPENDENT_AMBULATORY_CARE_PROVIDER_SITE_OTHER): Payer: 59 | Admitting: Urology

## 2013-03-13 DIAGNOSIS — N400 Enlarged prostate without lower urinary tract symptoms: Secondary | ICD-10-CM

## 2013-03-13 DIAGNOSIS — R972 Elevated prostate specific antigen [PSA]: Secondary | ICD-10-CM

## 2013-03-14 ENCOUNTER — Encounter: Payer: Self-pay | Admitting: Cardiology

## 2013-03-14 ENCOUNTER — Ambulatory Visit (INDEPENDENT_AMBULATORY_CARE_PROVIDER_SITE_OTHER): Payer: 59 | Admitting: Cardiology

## 2013-03-14 VITALS — BP 100/67 | HR 73 | Ht 70.0 in | Wt 201.0 lb

## 2013-03-14 DIAGNOSIS — I10 Essential (primary) hypertension: Secondary | ICD-10-CM

## 2013-03-14 DIAGNOSIS — R609 Edema, unspecified: Secondary | ICD-10-CM

## 2013-03-14 NOTE — Patient Instructions (Addendum)
The current medical regimen is effective;  continue present plan and medications.  Follow up in 6 months with Dr Hochrein.  You will receive a letter in the mail 2 months before you are due.  Please call us when you receive this letter to schedule your follow up appointment.  

## 2013-03-14 NOTE — Progress Notes (Signed)
HPI The patient presents for evaluation of edema.  I thought this was likely related to Norvasc and I reduced this from 10-5 mg in the past.  There was no change in the edema so I stopped it completely.  However, this made no difference. He was not doing well with the addition of spironolactone. He did have improvement in his blood pressure and a decrease in heart rate with beta blocker. At the last visit I increased his Norvasc back to 10 mg. I did check an echocardiogram and there was no evidence of pulmonary hypertension. He did have some moderate concentric left ventricular hypertrophy however.  Since I last saw him he has been keeping his feet elevated more.  He has kept an excellent blood pressure diary.  He's had much less feet swelling. His blood pressures have been under much better control though slightly elevated in the mornings. He overall feels better. He did not want to wear compression stockings. He is having no new shortness of breath, PND or orthopnea.  Allergies  Allergen Reactions  . Sulfa Antibiotics Diarrhea    Current Outpatient Prescriptions  Medication Sig Dispense Refill  . ACCU-CHEK FASTCLIX LANCETS MISC 1 each by Does not apply route 2 (two) times daily. Use to check BG up to twice daily   Dx:  250.00  200 each  1  . amLODipine (NORVASC) 10 MG tablet Take 1 tablet (10 mg total) by mouth daily.  30 tablet  11  . atorvastatin (LIPITOR) 40 MG tablet Take 1 tablet (40 mg total) by mouth daily.  30 tablet  4  . Blood Glucose Monitoring Suppl (ACCU-CHEK NANO SMARTVIEW) W/DEVICE KIT 1 kit by Does not apply route See admin instructions. Use to check BG up to twice daily Dx: 250.00  1 kit  0  . clotrimazole-betamethasone (LOTRISONE) cream Apply topically 2 (two) times daily.  30 g  1  . Desoximetasone (TOPICORT) 0.25 % ointment       . diclofenac sodium (VOLTAREN) 1 % GEL Apply topically 4 (four) times daily.      . furosemide (LASIX) 40 MG tablet Take 40 mg by mouth 2  (two) times daily.      Marland Kitchen glimepiride (AMARYL) 4 MG tablet TAKE 1 TABLET BY MOUTH TWICE DAILY  60 tablet  1  . glucose blood (ACCU-CHEK SMARTVIEW) test strip Use to check BG up to twice daily  DX:  250.00  200 each  1  . glyBURIDE (DIABETA) 5 MG tablet Take 1 tablet by mouth daily.      Marland Kitchen lisinopril (PRINIVIL,ZESTRIL) 20 MG tablet Take 20 mg by mouth 2 (two) times daily.        . metoprolol tartrate (LOPRESSOR) 25 MG tablet Take 1 tablet (25 mg total) by mouth 2 (two) times daily.  60 tablet  3  . montelukast (SINGULAIR) 10 MG tablet TAKE 1 TABLET BY MOUTH AT BEDTIME  30 tablet  3  . sitaGLIPtan-metformin (JANUMET) 50-1000 MG per tablet Take 1 tablet by mouth 2 (two) times daily with a meal.      . terazosin (HYTRIN) 10 MG capsule Take 1 capsule (10 mg total) by mouth 2 (two) times daily.  60 capsule  4  . zolpidem (AMBIEN) 10 MG tablet Take 10 mg by mouth at bedtime as needed for sleep.       No current facility-administered medications for this visit.    Past Medical History  Diagnosis Date  . Varicose veins of legs  right   . Blurred vision   . Diabetes mellitus   . Severe headache   . Syncopal episodes     with vision change   . Leg pain   . Hyperlipidemia   . Allergic rhinitis   . Arthritis   . Diverticulitis   . BPH (benign prostatic hyperplasia)   . Hypertension   . Hyposomnia, insomnia, or sleeplessness associated with anxiety   . Dermatitis     Past Surgical History  Procedure Laterality Date  . None      ROS:  As stated in the HPI and negative for all other systems.  PHYSICAL EXAM BP 100/67  Pulse 73  Ht 5\' 10"  (1.778 m)  Wt 201 lb (91.173 kg)  BMI 28.84 kg/m2 GENERAL:  Well appearing NECK:  No jugular venous distention, waveform within normal limits, carotid upstroke brisk and symmetric, no bruits, no thyromegaly LUNGS:  Clear to auscultation bilaterally BACK:  No CVA tenderness CHEST:  Unremarkable HEART:  PMI not displaced or sustained,S1 and S2  within normal limits, no S3, no S4, no clicks, no rubs, no murmurs ABD:  Flat, positive bowel sounds normal in frequency in pitch, no bruits, no rebound, no guarding, no midline pulsatile mass, no hepatomegaly, no splenomegaly EXT:  2 plus pulses throughout, moderate bilateral lower extremity edema, no cyanosis no clubbing  EKG:  Sinus tachycardia, rate 70, left axis deviation, low voltage in limb leads, poor anterior R wave progression, no acute ST-T wave changes. 03/14/2013  ASSESSMENT AND PLAN  EDEMA This is much  Improved on the current medical regimen and with keeping his feet elevated.no change in therapy is indicated.  TACHYCARDIA This is improved on beta blocker added recently so he will continue this.   HTN The blood pressure is  better controlled. He will continue on the meds as listed.

## 2013-03-26 ENCOUNTER — Other Ambulatory Visit: Payer: Self-pay | Admitting: Nurse Practitioner

## 2013-03-27 ENCOUNTER — Other Ambulatory Visit: Payer: Self-pay | Admitting: *Deleted

## 2013-03-27 NOTE — Telephone Encounter (Signed)
Patient last seen in office on 02-06-13. Rx last filled on 02-10-13 for #30. Please advise. If approved please have nurse call in to [facility]

## 2013-04-09 ENCOUNTER — Other Ambulatory Visit: Payer: Self-pay | Admitting: Nurse Practitioner

## 2013-04-11 NOTE — Telephone Encounter (Signed)
Last filled on 01-13-13 for #30. Please advise. If approved please have nurse phone in to [facility]. Thank you

## 2013-04-26 ENCOUNTER — Ambulatory Visit (INDEPENDENT_AMBULATORY_CARE_PROVIDER_SITE_OTHER): Payer: Medicare HMO | Admitting: Nurse Practitioner

## 2013-04-26 ENCOUNTER — Telehealth: Payer: Self-pay | Admitting: Physician Assistant

## 2013-04-26 ENCOUNTER — Ambulatory Visit (INDEPENDENT_AMBULATORY_CARE_PROVIDER_SITE_OTHER): Payer: Medicare HMO

## 2013-04-26 ENCOUNTER — Encounter: Payer: Self-pay | Admitting: Nurse Practitioner

## 2013-04-26 VITALS — BP 142/78 | HR 81 | Temp 96.5°F | Ht 69.5 in | Wt 200.0 lb

## 2013-04-26 DIAGNOSIS — R0781 Pleurodynia: Secondary | ICD-10-CM

## 2013-04-26 DIAGNOSIS — IMO0002 Reserved for concepts with insufficient information to code with codable children: Secondary | ICD-10-CM

## 2013-04-26 DIAGNOSIS — R079 Chest pain, unspecified: Secondary | ICD-10-CM

## 2013-04-26 DIAGNOSIS — S29011A Strain of muscle and tendon of front wall of thorax, initial encounter: Secondary | ICD-10-CM

## 2013-04-26 MED ORDER — CYCLOBENZAPRINE HCL 5 MG PO TABS: 5.0000 mg | ORAL_TABLET | Freq: Three times a day (TID) | ORAL | Status: AC | PRN

## 2013-04-26 NOTE — Progress Notes (Signed)
  Subjective:    Patient ID: Omar Porter, male    DOB: 1941-01-15, 72 y.o.   MRN: [id number]  HPI Patient in c/o flank pain- has been going to chiropractor for back pain- he manipulated him yesterday and about 20 minutes later he started having right flank pain- very painful to move. Rates 4/10 right now- Nothing helps the pain other han sitting perfectly still. Called EMS to house last night and "EKG was normal".    Review of Systems  Respiratory: Negative for chest tightness and shortness of breath.   Cardiovascular: Negative.   Gastrointestinal: Negative.   Genitourinary: Negative.   Neurological: Negative.   Hematological: Negative.        Objective:   Physical Exam  Constitutional: He appears well-developed and well-nourished.  Cardiovascular: Normal rate and normal heart sounds.   Pulmonary/Chest: Effort normal and breath sounds normal.  Pain on palpation right lower rib cage anteriorly- no echymosis- no rash    BP 142/78  Pulse 81  Temp(Src) 96.5 F (35.8 C) (Oral)  Ht 5' 9.5" (1.765 m)  Wt 200 lb (90.719 kg)  BMI 29.12 kg/m2 Chest x ray- Negative rib fracture-Preliminary reading by Paulene Floor, FNP  [facility]       Assessment & Plan:   1. Strain of chest wall, initial encounter   2. Rib pain on right side    Orders Placed This Encounter  Procedures  . DG Chest 2 View    Standing Status: Future     Number of Occurrences: 1     Standing Expiration Date: 06/26/2014    Order Specific Question:  Reason for Exam (SYMPTOM  OR DIAGNOSIS REQUIRED)    Answer:  right ant lower rib cage pain    Order Specific Question:  Preferred imaging location?    Answer:  Internal  Flexeril 5mg  1 po tid prn #30 0 refill Moist heat  Rest Cough and deep breathe q 2 hrs rto PRN  Mary-Margaret Daphine Deutscher, FNP

## 2013-04-26 NOTE — Patient Instructions (Signed)
Muscle Strain  Muscle strain occurs when a muscle is stretched beyond its normal length. A small number of muscle fibers generally are torn. This is especially common in athletes. This happens when a sudden, violent force placed on a muscle stretches it too far. Usually, recovery from muscle strain takes 1 to 2 weeks. Complete healing will take 5 to 6 weeks.   HOME CARE INSTRUCTIONS    While awake, apply ice to the sore muscle for the first 2 days after the injury.   Put ice in a plastic bag.   Place a towel between your skin and the bag.   Leave the ice on for 15-20 minutes each hour.   Do not use the strained muscle for several days, until you no longer have pain.   You may wrap the injured area with an elastic bandage for comfort. Be careful not to wrap it too tightly. This may interfere with blood circulation or increase swelling.   Only take over-the-counter or prescription medicines for pain, discomfort, or fever as directed by your caregiver.  SEEK MEDICAL CARE IF:   You have increasing pain or swelling in the injured area.  MAKE SURE YOU:    Understand these instructions.   Will watch your condition.   Will get help right away if you are not doing well or get worse.  Document Released: 11/01/2005 Document Revised: 01/24/2012 Document Reviewed: 11/13/2011  [employer] Patient Information 2014 [employer].

## 2013-05-09 ENCOUNTER — Emergency Department (HOSPITAL_COMMUNITY): Payer: Medicare HMO

## 2013-05-09 ENCOUNTER — Emergency Department (HOSPITAL_COMMUNITY)
Admission: EM | Admit: 2013-05-09 | Discharge: 2013-05-09 | Disposition: A | Discharge status: Home / Self Care | Payer: Medicare HMO | Attending: Emergency Medicine | Admitting: Emergency Medicine

## 2013-05-09 ENCOUNTER — Encounter (HOSPITAL_COMMUNITY): Payer: Self-pay | Admitting: *Deleted

## 2013-05-09 DIAGNOSIS — Y9389 Activity, other specified: Secondary | ICD-10-CM | POA: Insufficient documentation

## 2013-05-09 DIAGNOSIS — Z87891 Personal history of nicotine dependence: Secondary | ICD-10-CM | POA: Insufficient documentation

## 2013-05-09 DIAGNOSIS — S32009A Unspecified fracture of unspecified lumbar vertebra, initial encounter for closed fracture: Secondary | ICD-10-CM | POA: Insufficient documentation

## 2013-05-09 DIAGNOSIS — E785 Hyperlipidemia, unspecified: Secondary | ICD-10-CM | POA: Insufficient documentation

## 2013-05-09 DIAGNOSIS — Z8709 Personal history of other diseases of the respiratory system: Secondary | ICD-10-CM | POA: Insufficient documentation

## 2013-05-09 DIAGNOSIS — X58XXXA Exposure to other specified factors, initial encounter: Secondary | ICD-10-CM | POA: Insufficient documentation

## 2013-05-09 DIAGNOSIS — H538 Other visual disturbances: Secondary | ICD-10-CM | POA: Insufficient documentation

## 2013-05-09 DIAGNOSIS — K5732 Diverticulitis of large intestine without perforation or abscess without bleeding: Secondary | ICD-10-CM | POA: Insufficient documentation

## 2013-05-09 DIAGNOSIS — S32010A Wedge compression fracture of first lumbar vertebra, initial encounter for closed fracture: Secondary | ICD-10-CM

## 2013-05-09 DIAGNOSIS — F5105 Insomnia due to other mental disorder: Secondary | ICD-10-CM | POA: Insufficient documentation

## 2013-05-09 DIAGNOSIS — M129 Arthropathy, unspecified: Secondary | ICD-10-CM | POA: Insufficient documentation

## 2013-05-09 DIAGNOSIS — Z87448 Personal history of other diseases of urinary system: Secondary | ICD-10-CM | POA: Insufficient documentation

## 2013-05-09 DIAGNOSIS — Z791 Long term (current) use of non-steroidal anti-inflammatories (NSAID): Secondary | ICD-10-CM | POA: Insufficient documentation

## 2013-05-09 DIAGNOSIS — I10 Essential (primary) hypertension: Secondary | ICD-10-CM | POA: Insufficient documentation

## 2013-05-09 DIAGNOSIS — Z872 Personal history of diseases of the skin and subcutaneous tissue: Secondary | ICD-10-CM | POA: Insufficient documentation

## 2013-05-09 DIAGNOSIS — Z8679 Personal history of other diseases of the circulatory system: Secondary | ICD-10-CM | POA: Insufficient documentation

## 2013-05-09 DIAGNOSIS — F489 Nonpsychotic mental disorder, unspecified: Secondary | ICD-10-CM | POA: Insufficient documentation

## 2013-05-09 DIAGNOSIS — Z9889 Other specified postprocedural states: Secondary | ICD-10-CM | POA: Insufficient documentation

## 2013-05-09 DIAGNOSIS — E119 Type 2 diabetes mellitus without complications: Secondary | ICD-10-CM | POA: Insufficient documentation

## 2013-05-09 DIAGNOSIS — F064 Anxiety disorder due to known physiological condition: Secondary | ICD-10-CM | POA: Insufficient documentation

## 2013-05-09 DIAGNOSIS — Y929 Unspecified place or not applicable: Secondary | ICD-10-CM | POA: Insufficient documentation

## 2013-05-09 DIAGNOSIS — Z79899 Other long term (current) drug therapy: Secondary | ICD-10-CM | POA: Insufficient documentation

## 2013-05-09 MED ORDER — OXYCODONE-ACETAMINOPHEN 5-325 MG PO TABS
1.0000 | ORAL_TABLET | Freq: Once | ORAL | Status: AC
  Administered 2013-05-09: 1 via ORAL
  Filled 2013-05-09: qty 1

## 2013-05-09 MED ORDER — POLYETHYLENE GLYCOL 3350 17 GM/SCOOP PO POWD: 17.0000 g | Freq: Two times a day (BID) | ORAL | Status: DC

## 2013-05-09 MED ORDER — OXYCODONE-ACETAMINOPHEN 5-325 MG PO TABS: 1.0000 | ORAL_TABLET | ORAL | Status: DC | PRN

## 2013-05-09 NOTE — ED Provider Notes (Signed)
History    This chart was scribed for Osvaldo Human, MD by Quintella Reichert, ED scribe.  This patient was seen in room APA18/APA18 and the patient's care was started at 4:59 PM.   CSN: [id number]  Arrival date & time 05/09/13  1639    Chief Complaint  Patient presents with  . Back Pain    The history is provided by the patient. No language interpreter was used.    HPI Comments: Omar Porter is a 72 y.o. male with h/o arthritis, DM and peripheral edema who presents to the Emergency Department complaining of chronic back pain that was suddenly aggravated this morning after he strained to have a BM.  Pain is moderate-to-severe and is localized to the lower back and left hip.  It is exacerbated by certain changes in position and was not relieved by Tramadol taken 30 minutes pta.  Pt states that he initially began to experience back pain after he injured his back four months ago while he was clearing ice from his driveway.  Subsequent x-rays revealed a sciatic nerve etiology.  Two weeks ago he began to seek treatment with a chiropractor, and had significant relief from his first spinal adjustment.  However he developed right rib pain 30 minutes after his second appointment.when he bent over to pick something up.  This pain was diagnosed as a right chest wall strain by his PCP, and has now subsided.  Pt states that he has not had a BM for 5 days.  Today he was attempting to move his bowels and was "straining more than I should," and felt a mild twinge to the right side of his back.  He states he then went to his computer and as soon as he attempted to stand up again he experienced a severe aggravation of back pain described above, and was unable to stand up.  Pt denies h/o equally severe constipation and speculates it may be attributable to his large number of regular medications.  His wife also expresses some concern for fecal impaction.  Pt has no h/o back surgeries.    Past Medical History   Diagnosis Date  . Varicose veins of legs     right   . Blurred vision   . Diabetes mellitus   . Severe headache   . Syncopal episodes     with vision change   . Leg pain   . Hyperlipidemia   . Allergic rhinitis   . Arthritis   . Diverticulitis   . BPH (benign prostatic hyperplasia)   . Hypertension   . Hyposomnia, insomnia, or sleeplessness associated with anxiety   . Dermatitis    Past Surgical History  Procedure Laterality Date  . None     Family History  Problem Relation Age of Onset  . CAD Father    History  Substance Use Topics  . Smoking status: Former Smoker    Quit date: 11/01/1972  . Smokeless tobacco: Not on file  . Alcohol Use: No    Review of Systems  All other systems reviewed and are negative.    Allergies  Sulfa antibiotics  Home Medications   Current Outpatient Rx  Name  Route  Sig  Dispense  Refill  . ALPRAZolam (XANAX) 0.5 MG tablet   Oral   Take 0.5 mg by mouth at bedtime.          Marland Kitchen amLODipine (NORVASC) 10 MG tablet   Oral   Take 1 tablet (10 mg total) by  mouth daily.   30 tablet   11   . atorvastatin (LIPITOR) 40 MG tablet   Oral   Take 1 tablet (40 mg total) by mouth daily.   30 tablet   4   . cyclobenzaprine (FLEXERIL) 5 MG tablet   Oral   Take 1 tablet (5 mg total) by mouth 3 (three) times daily as needed for muscle spasms.   30 tablet   1   . esomeprazole (NEXIUM) 40 MG capsule   Oral   Take 40 mg by mouth daily.         Marland Kitchen glimepiride (AMARYL) 4 MG tablet   Oral   Take 4 mg by mouth 2 (two) times daily.         Marland Kitchen lisinopril (PRINIVIL,ZESTRIL) 20 MG tablet   Oral   Take 20 mg by mouth daily.          . metoprolol succinate (TOPROL-XL) 25 MG 24 hr tablet   Oral   Take 25 mg by mouth daily.         . montelukast (SINGULAIR) 10 MG tablet   Oral   Take 10 mg by mouth at bedtime.         Marland Kitchen terazosin (HYTRIN) 10 MG capsule   Oral   Take 1 capsule (10 mg total) by mouth 2 (two) times daily.   60  capsule   4   . traMADol (ULTRAM) 50 MG tablet   Oral   Take 50 mg by mouth every 6 (six) hours as needed for pain.         . clotrimazole-betamethasone (LOTRISONE) cream   Topical   Apply topically 2 (two) times daily.   30 g   1   . Desoximetasone (TOPICORT) 0.25 % ointment               . furosemide (LASIX) 40 MG tablet   Oral   Take 40 mg by mouth 2 (two) times daily.         Marland Kitchen glyBURIDE (DIABETA) 5 MG tablet   Oral   Take 1 tablet by mouth daily.         . sitaGLIPtan-metformin (JANUMET) 50-1000 MG per tablet   Oral   Take 1 tablet by mouth 2 (two) times daily with a meal.         . VOLTAREN 1 % GEL      APPLY TO AFFECTED AREA 4 TIMES A DAY   500 g   0   . zolpidem (AMBIEN) 10 MG tablet   Oral   Take 10 mg by mouth at bedtime as needed for sleep.          BP 148/85  Pulse 87  Temp(Src) 98.3 F (36.8 C)  Resp 20  SpO2 100%  Physical Exam  Nursing note and vitals reviewed. Constitutional: He is oriented to person, place, and time. He appears well-developed and well-nourished. No distress.  Awake, alert  HENT:  Head: Normocephalic and atraumatic.  Right Ear: External ear normal.  Left Ear: External ear normal.  Nose: Nose normal.  Mouth/Throat: Oropharynx is clear and moist. No oropharyngeal exudate.  Poor denitition  Eyes: Conjunctivae and EOM are normal. Pupils are equal, round, and reactive to light.  Neck: Normal range of motion. Neck supple. No tracheal deviation present.  Cardiovascular: Normal rate, regular rhythm and normal heart sounds.   No murmur heard. Pulmonary/Chest: Effort normal and breath sounds normal. No respiratory distress. He has no wheezes. He  has no rales.  Abdominal: Soft. There is no tenderness.  Genitourinary:  Rectal exam: No mass, no tenderness, no fecal impaction.  Hemoccult negative.  Musculoskeletal: Normal range of motion. He exhibits edema.  Pain localized to left hip and general lumbar region. No  palpable deformity. Pain on changing position localized to lower back. 1+-2+ pitting edema in both ankles  Neurological: He is alert and oriented to person, place, and time. Coordination normal.  Neurologically intact  Skin: Skin is warm and dry.  Psychiatric: He has a normal mood and affect. His behavior is normal.    ED Course  Procedures (including critical care time)  DIAGNOSTIC STUDIES: Oxygen Saturation is 100% on room air, normal by my interpretation.    COORDINATION OF CARE: 5:14 PM-Discussed treatment plan which includes x-rays with pt at bedside and pt agreed to plan.    7:09 PM: Informed pt that x-rays revealed compression fractures of the L1, L3, and L4 vertebrae of the lumbar spine, and ruled out hip fracture.  Discussed treatment plan including Percocet and rest, and advised regular Miralax use to counteract likely constipation secondary to Percocet usage. Pt expressed understanding and was agreeable.   Dg Lumbar Spine Complete  05/09/2013   *RADIOLOGY REPORT*  Clinical Data: Low back pain.  No injury  LUMBAR SPINE - COMPLETE 4+ VIEW  Comparison: 09/14/2012  Findings: Compression fracture L1 appears acute and not present previously.  Compression fractures L3 and L4 appear acute and were not present previously.  Mild disc degeneration L3-4 and L4-5 and L5-S1.  Negative for fracture or pars defect.  IMPRESSION: Compression fractures L1, L3, and L4 appear acute.   Original Report Authenticated By: Janeece Riggers, M.D.    Dg Hip Complete Left  05/09/2013   *RADIOLOGY REPORT*  Clinical Data: Left hip pain.  No injury  LEFT HIP - COMPLETE 2+ VIEW  Comparison:  None.  Findings:  There is no evidence of hip fracture or dislocation. There is no evidence of arthropathy or other focal bone abnormality.  IMPRESSION: Negative.   Original Report Authenticated By: Janeece Riggers, M.D.   Rx for lumbar compression fractures with rest, percocet for pain, miralax for constipation, F/U with Dr. Vernon Prey at [facility].   1. Compression fracture of L1 lumbar vertebra, closed, initial encounter     I personally performed the services described in this documentation, which was scribed in my presence. The recorded information has been reviewed and is accurate.  Osvaldo Human, MD      Carleene Cooper III, MD 05/09/13 [id number]

## 2013-05-09 NOTE — ED Notes (Signed)
Pt had previous back injury in February. Seen by PMD and Chiropractor. Pt states he was trying to have a BM and may have strained to hard, states he felt a pop to right side of back. Went to sit in a chair using computer afterwards and was having trouble getting up out of chair just 35 min PTA

## 2013-05-09 NOTE — ED Notes (Signed)
Pt received Morphine 10mg  IV by EMS PTA in divided doses.

## 2013-05-16 ENCOUNTER — Telehealth: Payer: Self-pay | Admitting: Nurse Practitioner

## 2013-05-16 NOTE — Telephone Encounter (Signed)
Wife states that he is unable to get up and he is lethargic. BS 109 and BP slightly elevated. She is worried that he is getting dehydrated due to all the vomiting. Advised her to take to ER for evaluation

## 2013-05-20 ENCOUNTER — Emergency Department (HOSPITAL_COMMUNITY): Payer: Medicare HMO

## 2013-05-20 ENCOUNTER — Emergency Department (HOSPITAL_COMMUNITY)
Admission: EM | Admit: 2013-05-20 | Discharge: 2013-05-20 | Disposition: A | Discharge status: Home / Self Care | Payer: Medicare HMO | Attending: Emergency Medicine | Admitting: Emergency Medicine

## 2013-05-20 ENCOUNTER — Encounter (HOSPITAL_COMMUNITY): Payer: Self-pay | Admitting: Emergency Medicine

## 2013-05-20 DIAGNOSIS — F064 Anxiety disorder due to known physiological condition: Secondary | ICD-10-CM | POA: Insufficient documentation

## 2013-05-20 DIAGNOSIS — F5105 Insomnia due to other mental disorder: Secondary | ICD-10-CM | POA: Insufficient documentation

## 2013-05-20 DIAGNOSIS — N419 Inflammatory disease of prostate, unspecified: Secondary | ICD-10-CM | POA: Insufficient documentation

## 2013-05-20 DIAGNOSIS — Z8739 Personal history of other diseases of the musculoskeletal system and connective tissue: Secondary | ICD-10-CM | POA: Insufficient documentation

## 2013-05-20 DIAGNOSIS — Z872 Personal history of diseases of the skin and subcutaneous tissue: Secondary | ICD-10-CM | POA: Insufficient documentation

## 2013-05-20 DIAGNOSIS — R300 Dysuria: Secondary | ICD-10-CM | POA: Insufficient documentation

## 2013-05-20 DIAGNOSIS — N400 Enlarged prostate without lower urinary tract symptoms: Secondary | ICD-10-CM | POA: Insufficient documentation

## 2013-05-20 DIAGNOSIS — Z87312 Personal history of (healed) stress fracture: Secondary | ICD-10-CM | POA: Insufficient documentation

## 2013-05-20 DIAGNOSIS — R51 Headache: Secondary | ICD-10-CM | POA: Insufficient documentation

## 2013-05-20 DIAGNOSIS — Z8679 Personal history of other diseases of the circulatory system: Secondary | ICD-10-CM | POA: Insufficient documentation

## 2013-05-20 DIAGNOSIS — Z8669 Personal history of other diseases of the nervous system and sense organs: Secondary | ICD-10-CM | POA: Insufficient documentation

## 2013-05-20 DIAGNOSIS — R21 Rash and other nonspecific skin eruption: Secondary | ICD-10-CM | POA: Insufficient documentation

## 2013-05-20 DIAGNOSIS — Z87891 Personal history of nicotine dependence: Secondary | ICD-10-CM | POA: Insufficient documentation

## 2013-05-20 DIAGNOSIS — Z8781 Personal history of (healed) traumatic fracture: Secondary | ICD-10-CM | POA: Insufficient documentation

## 2013-05-20 DIAGNOSIS — R1031 Right lower quadrant pain: Secondary | ICD-10-CM | POA: Insufficient documentation

## 2013-05-20 DIAGNOSIS — E119 Type 2 diabetes mellitus without complications: Secondary | ICD-10-CM | POA: Insufficient documentation

## 2013-05-20 DIAGNOSIS — Z8719 Personal history of other diseases of the digestive system: Secondary | ICD-10-CM | POA: Insufficient documentation

## 2013-05-20 DIAGNOSIS — I10 Essential (primary) hypertension: Secondary | ICD-10-CM | POA: Insufficient documentation

## 2013-05-20 DIAGNOSIS — Z79899 Other long term (current) drug therapy: Secondary | ICD-10-CM | POA: Insufficient documentation

## 2013-05-20 DIAGNOSIS — E785 Hyperlipidemia, unspecified: Secondary | ICD-10-CM | POA: Insufficient documentation

## 2013-05-20 DIAGNOSIS — F489 Nonpsychotic mental disorder, unspecified: Secondary | ICD-10-CM | POA: Insufficient documentation

## 2013-05-20 DIAGNOSIS — M7989 Other specified soft tissue disorders: Secondary | ICD-10-CM | POA: Insufficient documentation

## 2013-05-20 DIAGNOSIS — M549 Dorsalgia, unspecified: Secondary | ICD-10-CM | POA: Insufficient documentation

## 2013-05-20 HISTORY — DX: Fatigue fracture of vertebra, lumbar region, initial encounter for fracture: M48.46XA

## 2013-05-20 LAB — CBC WITH DIFFERENTIAL/PLATELET
Basophils Relative: 1 % (ref 0–1)
HCT: 46.9 % (ref 39.0–52.0)
Hemoglobin: 16.7 g/dL (ref 13.0–17.0)
Lymphocytes Relative: 26 % (ref 12–46)
Lymphs Abs: 2.3 10*3/uL (ref 0.7–4.0)
MCHC: 35.6 g/dL (ref 30.0–36.0)
Monocytes Absolute: 0.9 10*3/uL (ref 0.1–1.0)
Monocytes Relative: 10 % (ref 3–12)
Neutro Abs: 5.5 10*3/uL (ref 1.7–7.7)
Neutrophils Relative %: 62 % (ref 43–77)
RBC: 5.51 MIL/uL (ref 4.22–5.81)
WBC: 8.8 10*3/uL (ref 4.0–10.5)

## 2013-05-20 LAB — URINALYSIS, ROUTINE W REFLEX MICROSCOPIC
Bilirubin Urine: NEGATIVE
Glucose, UA: 100 mg/dL — AB
Leukocytes, UA: NEGATIVE
Nitrite: NEGATIVE
Specific Gravity, Urine: 1.02 (ref 1.005–1.030)
pH: 6 (ref 5.0–8.0)

## 2013-05-20 LAB — BASIC METABOLIC PANEL
BUN: 8 mg/dL (ref 6–23)
CO2: 26 mEq/L (ref 19–32)
Chloride: 97 mEq/L (ref 96–112)
GFR calc Af Amer: >90 mL/min (ref >90)
Potassium: 3.2 mEq/L — ABNORMAL LOW (ref 3.5–5.1)

## 2013-05-20 MED ORDER — SODIUM CHLORIDE 0.9 % IV BOLUS (SEPSIS): 250.0000 mL | Freq: Once | INTRAVENOUS | Status: DC

## 2013-05-20 MED ORDER — SODIUM CHLORIDE 0.9 % IV BOLUS (SEPSIS): 500.0000 mL | Freq: Once | INTRAVENOUS | Status: DC

## 2013-05-20 MED ORDER — SODIUM CHLORIDE 0.9 % IV SOLN: INTRAVENOUS | Status: DC

## 2013-05-20 MED ORDER — CIPROFLOXACIN HCL 500 MG PO TABS: 500.0000 mg | ORAL_TABLET | Freq: Two times a day (BID) | ORAL | Status: DC

## 2013-05-20 MED ORDER — CIPROFLOXACIN IN D5W 400 MG/200ML IV SOLN
400.0000 mg | Freq: Once | INTRAVENOUS | Status: AC
  Administered 2013-05-20: 400 mg via INTRAVENOUS
  Filled 2013-05-20: qty 200

## 2013-05-20 MED ORDER — IOHEXOL 300 MG/ML  SOLN
100.0000 mL | Freq: Once | INTRAMUSCULAR | Status: AC | PRN
  Administered 2013-05-20: 100 mL via INTRAVENOUS

## 2013-05-20 MED ORDER — IOHEXOL 300 MG/ML  SOLN
50.0000 mL | Freq: Once | INTRAMUSCULAR | Status: AC | PRN
  Administered 2013-05-20: 50 mL via ORAL

## 2013-05-20 NOTE — ED Notes (Signed)
Discharge instructions given and reviewed with patient.  Prescription given for Cipro; effects and use explained.  Patient verbalized understanding to complete antibiotic and to follow up with urology this week.  Patient discharged home in good condition via wheelchair.

## 2013-05-20 NOTE — ED Notes (Signed)
Patient sitting up on side of bed; states he feels better and wants to go home.

## 2013-05-20 NOTE — ED Notes (Signed)
Pt c/o chills x 1 week that has been more frequent, every . Could not pass urine last night. Has urinated "a few times" since. Alert/oriented. C/o r side/hip pain radiating down right leg x 2 days but pt states is chronic and worse past 2 days. N/v on Monday and Tuesday but none since. Denies diarrhea.

## 2013-05-20 NOTE — ED Provider Notes (Signed)
History    This chart was scribed for Shelda Jakes, MD, MD by Ashley Jacobs, ED Scribe. The patient was seen in room  and the patient's care was started at 4:49 PM CSN: [id number] Arrival date & time 05/20/13  1553 5   Chief Complaint  Patient presents with  . Chills    The history is provided by the patient, medical records and a significant other. No language interpreter was used.   HPI Comments: Omar Porter is a 72 y.o. male who presents to the Emergency Department complaining of intermittent chills onset a week PTA.Pt reports waking around 2am the morning PTA experiencing difficulty urinating and pain in lower abdomen.  Pt reports dyuria ("burning")  and feeling "overloaded" .  Upon onset of pain in abdomen, pt reports immediately feeling chills from toes to head. Pt reports that chills worsen from every 40 mins to every 2 mins PTA. Pt reports having a history of chills assoicated with low bp.  Pt has hx of lumbar fractures which occurred several wks ago. Dr. Daphane Shepherd, in [place] at  [facility] is pt's PCP. Pt denies being on blood thinners.  Past Medical History  Diagnosis Date  . Varicose veins of legs     right   . Blurred vision   . Diabetes mellitus   . Severe headache   . Syncopal episodes     with vision change   . Leg pain   . Hyperlipidemia   . Allergic rhinitis   . Arthritis   . Diverticulitis   . BPH (benign prostatic hyperplasia)   . Hypertension   . Hyposomnia, insomnia, or sleeplessness associated with anxiety   . Dermatitis   . Lumbar stress fracture    Past Surgical History  Procedure Laterality Date  . None     Family History  Problem Relation Age of Onset  . CAD Father    History  Substance Use Topics  . Smoking status: Former Smoker    Quit date: 11/01/1972  . Smokeless tobacco: Not on file  . Alcohol Use: No    Review of Systems  Constitutional: Positive for chills. Negative for fever.  HENT: Negative for congestion, sore  throat and rhinorrhea.   Eyes: Negative for visual disturbance.  Cardiovascular: Positive for leg swelling (bilateral). Negative for chest pain.  Gastrointestinal: Positive for abdominal pain (lower right). Negative for nausea, vomiting and diarrhea.       Reflux  Genitourinary: Positive for dysuria and difficulty urinating. Negative for hematuria.  Musculoskeletal: Positive for back pain (R).  Skin: Positive for rash.  Neurological: Positive for headaches (in the mornings).    Allergies  Sulfa antibiotics  Home Medications   Current Outpatient Rx  Name  Route  Sig  Dispense  Refill  . amLODipine (NORVASC) 10 MG tablet   Oral   Take 1 tablet (10 mg total) by mouth daily.   30 tablet   11   . clotrimazole-betamethasone (LOTRISONE) cream   Topical   Apply 1 application topically at bedtime. Applied to groin area at bedtime         . esomeprazole (NEXIUM) 40 MG capsule   Oral   Take 40 mg by mouth daily.         . furosemide (LASIX) 40 MG tablet   Oral   Take 40 mg by mouth 2 (two) times daily.         Marland Kitchen glimepiride (AMARYL) 4 MG tablet   Oral  Take 4 mg by mouth 2 (two) times daily.         Marland Kitchen lisinopril (PRINIVIL,ZESTRIL) 20 MG tablet   Oral   Take 20 mg by mouth daily.          . metoprolol succinate (TOPROL-XL) 25 MG 24 hr tablet   Oral   Take 25 mg by mouth daily.         . metoprolol tartrate (LOPRESSOR) 25 MG tablet   Oral   Take 25 mg by mouth daily.         . montelukast (SINGULAIR) 10 MG tablet   Oral   Take 10 mg by mouth at bedtime.         . polyethylene glycol powder (GLYCOLAX/MIRALAX) powder   Oral   Take 17 g by mouth 2 (two) times daily.   255 g   0   . simvastatin (ZOCOR) 40 MG tablet   Oral   Take 40 mg by mouth at bedtime.         Marland Kitchen terazosin (HYTRIN) 10 MG capsule   Oral   Take 1 capsule (10 mg total) by mouth 2 (two) times daily.   60 capsule   4   . traMADol (ULTRAM) 50 MG tablet   Oral   Take 50 mg by  mouth every 6 (six) hours as needed for pain.         Marland Kitchen ALPRAZolam (XANAX) 0.5 MG tablet   Oral   Take 0.5 mg by mouth at bedtime.          . ciprofloxacin (CIPRO) 500 MG tablet   Oral   Take 1 tablet (500 mg total) by mouth 2 (two) times daily.   28 tablet   0   . cyclobenzaprine (FLEXERIL) 5 MG tablet   Oral   Take 1 tablet (5 mg total) by mouth 3 (three) times daily as needed for muscle spasms.   30 tablet   1   . diclofenac sodium (VOLTAREN) 1 % GEL   Topical   Apply 2-4 g topically 4 (four) times daily as needed (for arthritis pain).         Marland Kitchen oxyCODONE-acetaminophen (PERCOCET/ROXICET) 5-325 MG per tablet   Oral   Take 1 tablet by mouth every 4 (four) hours as needed for pain.   20 tablet   0    BP 141/99  Pulse 92  Temp(Src) 97.5 F (36.4 C) (Oral)  Resp 20  Ht 5\' 9"  (1.753 m)  Wt 198 lb (89.812 kg)  BMI 29.23 kg/m2  SpO2 98% Physical Exam  Constitutional: He is oriented to person, place, and time. He appears well-developed and well-nourished.  HENT:  Head: Normocephalic and atraumatic.  Right Ear: External ear normal.  Left Ear: External ear normal.  Dry mucus membrane  Eyes: EOM are normal. Pupils are equal, round, and reactive to light.  Cardiovascular: Normal rate, regular rhythm and normal heart sounds.   No murmur heard. Pulmonary/Chest: Effort normal and breath sounds normal.  Abdominal: Soft. Bowel sounds are normal. He exhibits no distension.  Musculoskeletal: He exhibits edema (minimal).  Neurological: He is alert and oriented to person, place, and time. No cranial nerve deficit. He exhibits normal muscle tone. Coordination normal.  Skin: Skin is warm and dry.    ED Course  Procedures (including critical care time) DIAGNOSTIC STUDIES: Oxygen Saturation is 98% on room air, normal by my interpretation.    COORDINATION OF CARE: 4:53 PM Discussed course of care with  pt which includes blood work. Pt understands and agrees.   Labs  Reviewed  BASIC METABOLIC PANEL - Abnormal; Notable for the following:    Sodium 134 (*)    Potassium 3.2 (*)    Glucose, Bld 206 (*)    All other components within normal limits  URINALYSIS, ROUTINE W REFLEX MICROSCOPIC - Abnormal; Notable for the following:    Glucose, UA 100 (*)    All other components within normal limits  CULTURE, BLOOD (ROUTINE X 2)  CULTURE, BLOOD (ROUTINE X 2)  CBC WITH DIFFERENTIAL  LACTIC ACID, PLASMA    Results for orders placed during the hospital encounter of 05/20/13  CULTURE, BLOOD (ROUTINE X 2)      Result Value Range   Specimen Description BLOOD RIGHT ARM     Special Requests BOTTLES DRAWN AEROBIC AND ANAEROBIC 6CC EACH     Culture PENDING     Report Status PENDING    CULTURE, BLOOD (ROUTINE X 2)      Result Value Range   Specimen Description BLOOD LEFT ARM     Special Requests BOTTLES DRAWN AEROBIC AND ANAEROBIC 5CC EACH     Culture PENDING     Report Status PENDING    CBC WITH DIFFERENTIAL      Result Value Range   WBC 8.8  4.0 - 10.5 K/uL   RBC 5.51  4.22 - 5.81 MIL/uL   Hemoglobin 16.7  13.0 - 17.0 g/dL   HCT 96.0  45.4 - 09.8 %   MCV 85.1  78.0 - 100.0 fL   MCH 30.3  26.0 - 34.0 pg   MCHC 35.6  30.0 - 36.0 g/dL   RDW 11.9  14.7 - 82.9 %   Platelets 280  150 - 400 K/uL   Neutrophils Relative % 62  43 - 77 %   Neutro Abs 5.5  1.7 - 7.7 K/uL   Lymphocytes Relative 26  12 - 46 %   Lymphs Abs 2.3  0.7 - 4.0 K/uL   Monocytes Relative 10  3 - 12 %   Monocytes Absolute 0.9  0.1 - 1.0 K/uL   Eosinophils Relative 2  0 - 5 %   Eosinophils Absolute 0.2  0.0 - 0.7 K/uL   Basophils Relative 1  0 - 1 %   Basophils Absolute 0.1  0.0 - 0.1 K/uL  BASIC METABOLIC PANEL      Result Value Range   Sodium 134 (*) 135 - 145 mEq/L   Potassium 3.2 (*) 3.5 - 5.1 mEq/L   Chloride 97  96 - 112 mEq/L   CO2 26  19 - 32 mEq/L   Glucose, Bld 206 (*) 70 - 99 mg/dL   BUN 8  6 - 23 mg/dL   Creatinine, Ser 5.62  0.50 - 1.35 mg/dL   Calcium 9.8  8.4 - 13.0  mg/dL   GFR calc non Af Amer >90  >90 mL/min   GFR calc Af Amer >90  >90 mL/min  URINALYSIS, ROUTINE W REFLEX MICROSCOPIC      Result Value Range   Color, Urine YELLOW  YELLOW   APPearance CLEAR  CLEAR   Specific Gravity, Urine 1.020  1.005 - 1.030   pH 6.0  5.0 - 8.0   Glucose, UA 100 (*) NEGATIVE mg/dL   Hgb urine dipstick NEGATIVE  NEGATIVE   Bilirubin Urine NEGATIVE  NEGATIVE   Ketones, ur NEGATIVE  NEGATIVE mg/dL   Protein, ur NEGATIVE  NEGATIVE mg/dL   Urobilinogen,  UA 0.2  0.0 - 1.0 mg/dL   Nitrite NEGATIVE  NEGATIVE   Leukocytes, UA NEGATIVE  NEGATIVE  LACTIC ACID, PLASMA      Result Value Range   Lactic Acid, Venous 1.5  0.5 - 2.2 mmol/L      Dg Chest 2 View  05/20/2013   *RADIOLOGY REPORT*  Clinical Data: Chills.  CHEST - 2 VIEW  Comparison: None.  Findings: Mild hyperinflation of the lungs.  Tortuosity of the thoracic aorta.  Heart is normal size.  Lungs are clear.  No effusions or acute bony abnormality.  IMPRESSION: Mild hyperinflation.  No acute findings.   Original Report Authenticated By: Charlett Nose, M.D.   Ct Head Wo Contrast  05/20/2013   *RADIOLOGY REPORT*  Clinical Data: Blurred vision.  Hypertension.  Chills.  Diabetes.  CT HEAD WITHOUT CONTRAST  Technique:  Contiguous axial images were obtained from the base of the skull through the vertex without contrast.  Comparison: 03/30/2011  Findings: The brain stem, cerebellum, cerebral peduncles, thalami, basal ganglia, basilar cisterns, and ventricular system appear unremarkable. Periventricular and corona radiata white matter hypodensities are most compatible with chronic ischemic microvascular white matter disease.  No intracranial hemorrhage, mass lesion, or acute infarction is identified.  No specific abnormality of the orbits identified.  Minimal chronic ethmoid sinusitis.  IMPRESSION:  1.  No acute intracranial findings. 2. Periventricular and corona radiata white matter hypodensities are most compatible with chronic  ischemic microvascular white matter disease. 3.  Minimal chronic ethmoid sinusitis.   Original Report Authenticated By: Gaylyn Rong, M.D.   Ct Abdomen Pelvis W Contrast  05/20/2013   *RADIOLOGY REPORT*  Clinical Data: Chills, diverticulosis.  CT ABDOMEN AND PELVIS WITH CONTRAST  Technique:  Multidetector CT imaging of the abdomen and pelvis was performed following the standard protocol during bolus administration of intravenous contrast.  Contrast:  OMNIPAQUE IOHEXOL 300 MG/ML  SOLN  Comparison: 03/29/2007  Findings: Minimal scarring or atelectasis in the left base.  Right lung bases clear.  Heart is normal size.  No effusions.  Liver, gallbladder, spleen, pancreas, adrenals and right kidney are unremarkable.  Small nonobstructing left renal stones.  Urinary bladder is mildly distended.  Prostate is enlarged and protrudes into the inferior aspect of the bladder.  Cannot exclude a component of bladder outlet obstruction.  Descending colonic and sigmoid diverticulosis.  No active diverticulitis.  Small bowel is decompressed.  Appendix is visualized and is normal.  Aorta is normal caliber.  Mild compression fracture through the superior endplate of L1 and the inferior endplate of L3 and L4.  This is new since 2008, throat but presumably chronic.  Small umbilical hernia containing fat.  IMPRESSION: Left nephrolithiasis.  Enlarged prostate which protrudes into the inferior aspect of the bladder.  Bladder is mildly distended.  Cannot exclude a component of bladder outlet obstruction.  Descending colonic and sigmoid diverticulosis.  No active diverticulitis.   Original Report Authenticated By: Charlett Nose, M.D.   1. Prostatitis     MDM  Patient's workup without a significant abnormalities. Clinically was suspicious for the possibility of prostatitis. CT scan shows enlargement of the prostate with Gleason workup by urology. We'll treat for prostatitis clinically patient given Cipro in the emergency  department we'll continue Cipro 500 mg twice a day for the next 14 days. We'll followup with urology. Blood cultures were done and are pending. Lactic acid was not elevated. Vital signs without evidence of fever or anything consistent with sepsis. Head CT was negative  CT of the abdomen showed the enlarged prostate chest x-ray was negative for pneumonia. No significant leukocytosis. No renal insufficiency.    I personally performed the services described in this documentation, which was scribed in my presence. The recorded information has been reviewed and is accurate.     Shelda Jakes, MD 05/20/13 [id number]

## 2013-05-20 NOTE — ED Notes (Signed)
CT tech notified that patient has completed contrast.

## 2013-05-20 NOTE — ED Notes (Signed)
Patient ambulatory to bathroom with use of cane and assisted by this RN.

## 2013-05-21 ENCOUNTER — Other Ambulatory Visit: Payer: Self-pay

## 2013-05-21 MED ORDER — LISINOPRIL 20 MG PO TABS: 20.0000 mg | ORAL_TABLET | Freq: Every day | ORAL | Status: AC

## 2013-05-22 DIAGNOSIS — E1149 Type 2 diabetes mellitus with other diabetic neurological complication: Secondary | ICD-10-CM | POA: Insufficient documentation

## 2013-05-22 DIAGNOSIS — E119 Type 2 diabetes mellitus without complications: Secondary | ICD-10-CM | POA: Insufficient documentation

## 2013-05-22 DIAGNOSIS — I152 Hypertension secondary to endocrine disorders: Secondary | ICD-10-CM | POA: Insufficient documentation

## 2013-05-22 DIAGNOSIS — F419 Anxiety disorder, unspecified: Secondary | ICD-10-CM | POA: Insufficient documentation

## 2013-05-24 ENCOUNTER — Other Ambulatory Visit: Payer: Self-pay | Admitting: Physician Assistant

## 2013-05-25 LAB — CULTURE, BLOOD (ROUTINE X 2): Culture: NO GROWTH

## 2013-06-11 ENCOUNTER — Other Ambulatory Visit: Payer: Self-pay | Admitting: Family Medicine

## 2013-06-14 ENCOUNTER — Other Ambulatory Visit: Payer: Self-pay | Admitting: Family Medicine

## 2013-06-14 ENCOUNTER — Other Ambulatory Visit: Payer: Self-pay | Admitting: Physician Assistant

## 2013-06-20 ENCOUNTER — Other Ambulatory Visit: Payer: Self-pay

## 2013-07-04 ENCOUNTER — Other Ambulatory Visit: Payer: Self-pay | Admitting: Physician Assistant

## 2013-07-06 NOTE — Telephone Encounter (Signed)
done

## 2013-07-17 ENCOUNTER — Ambulatory Visit (INDEPENDENT_AMBULATORY_CARE_PROVIDER_SITE_OTHER): Payer: 59 | Admitting: Urology

## 2013-07-17 DIAGNOSIS — R972 Elevated prostate specific antigen [PSA]: Secondary | ICD-10-CM

## 2013-07-17 DIAGNOSIS — N390 Urinary tract infection, site not specified: Secondary | ICD-10-CM

## 2013-09-04 ENCOUNTER — Ambulatory Visit (INDEPENDENT_AMBULATORY_CARE_PROVIDER_SITE_OTHER): Payer: 59 | Admitting: Urology

## 2013-09-04 DIAGNOSIS — N138 Other obstructive and reflux uropathy: Secondary | ICD-10-CM

## 2013-09-04 DIAGNOSIS — N401 Enlarged prostate with lower urinary tract symptoms: Secondary | ICD-10-CM

## 2013-09-04 DIAGNOSIS — R972 Elevated prostate specific antigen [PSA]: Secondary | ICD-10-CM

## 2013-09-20 ENCOUNTER — Other Ambulatory Visit: Payer: Self-pay

## 2014-06-08 IMAGING — CR DG CHEST 2V
2 series · 2 of 2 positions shown · non-contrast
Comparison: None.

CLINICAL DATA: Chest pain

CHEST - 2 VIEW

[view not recorded (1 of 2)]
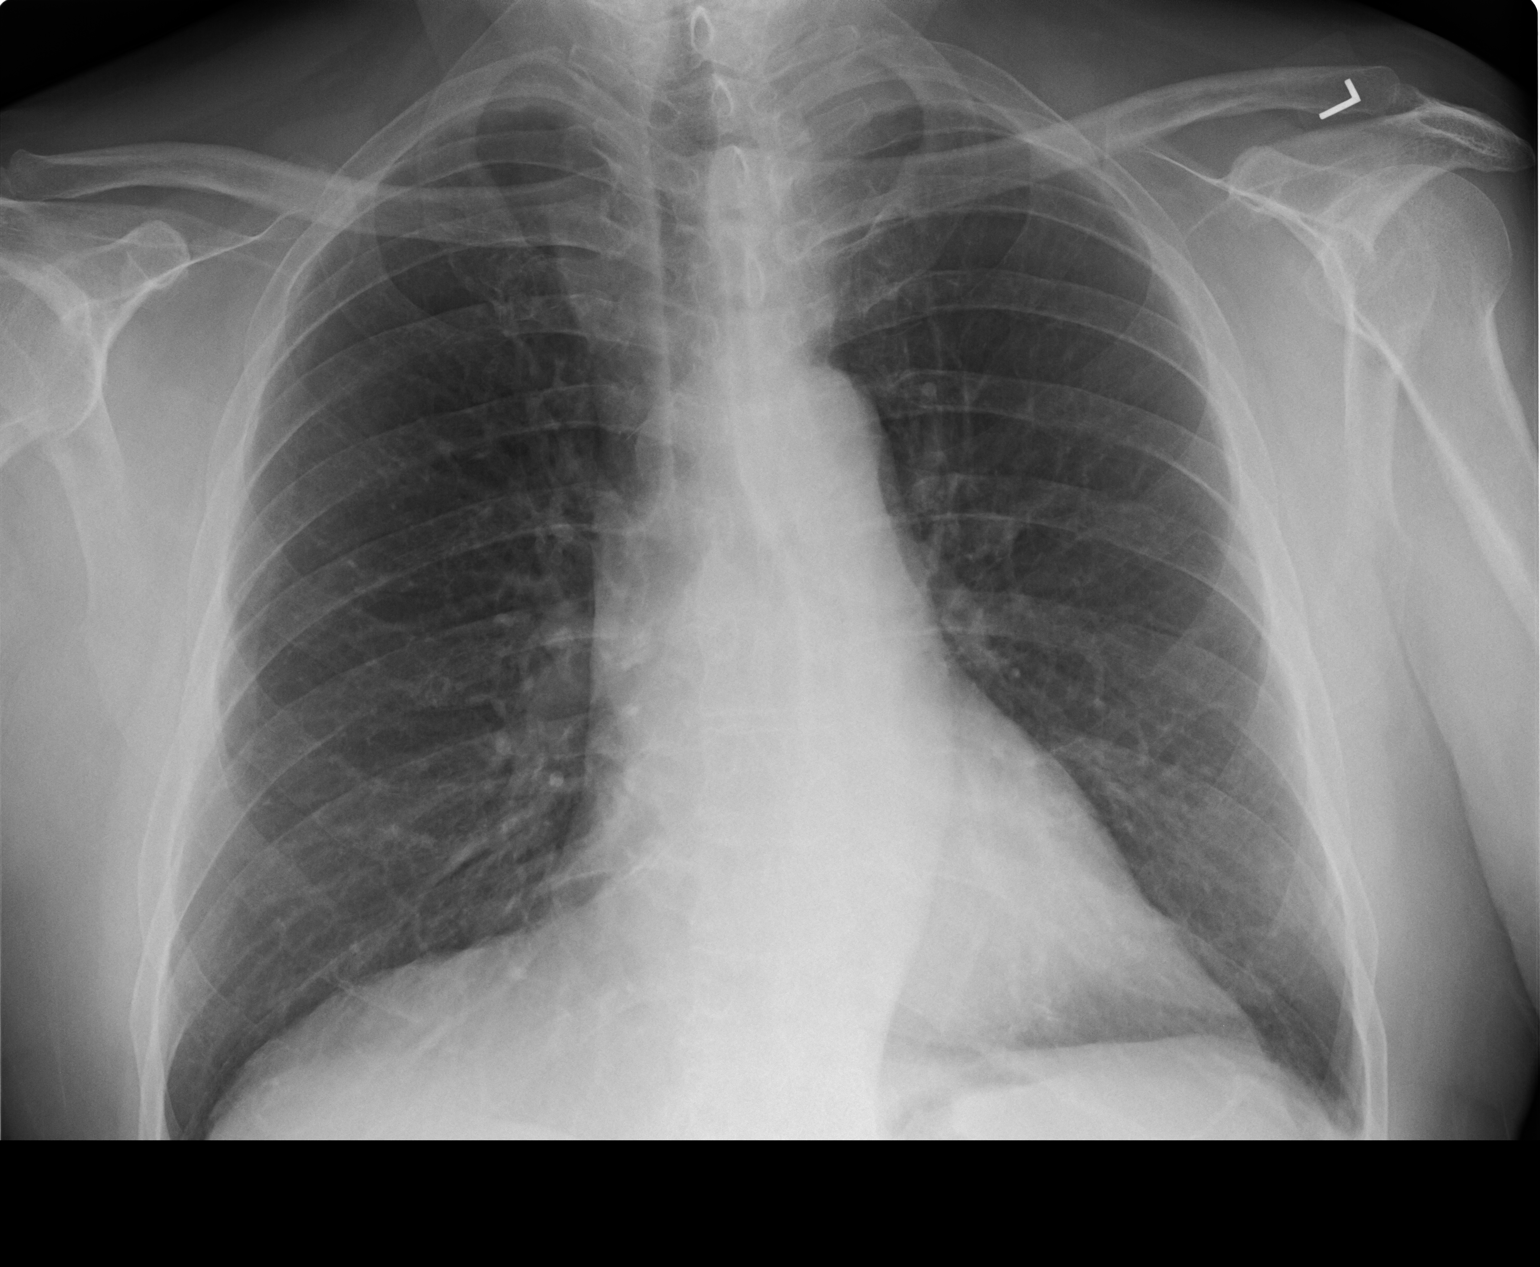

[view not recorded (2 of 2)]
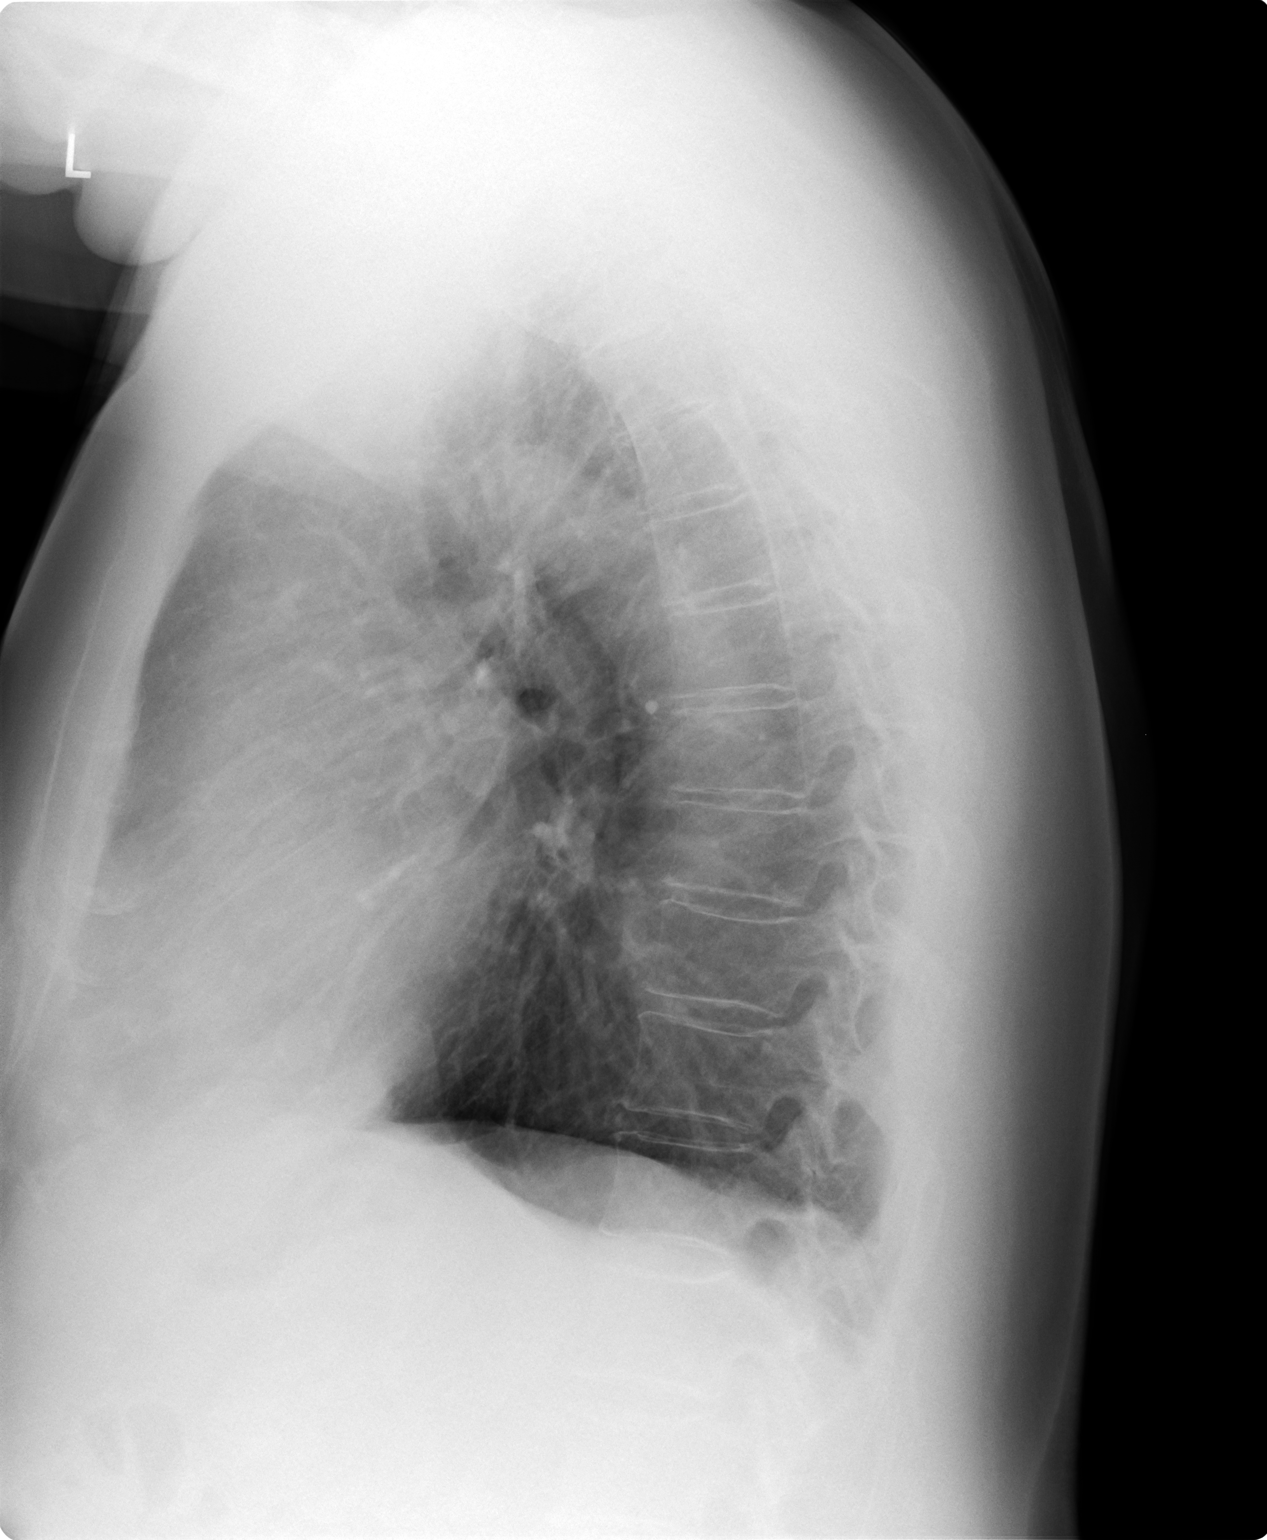

[2 of 2 positions shown; findings below may reference images not displayed]

FINDINGS: No active infiltrate or effusion is seen.  The lungs are
slightly hyperaerated.  Mediastinal contours appear normal.  The
heart is within normal limits in size.  No bony abnormality is
seen.
IMPRESSION: No active lung disease.

## 2014-09-03 ENCOUNTER — Ambulatory Visit: Payer: 59 | Admitting: Urology

## 2014-10-01 ENCOUNTER — Ambulatory Visit: Payer: 59 | Admitting: Urology

## 2014-12-16 IMAGING — CR DG CHEST 2V
2 series · 2 of 2 positions shown · non-contrast
Comparison: Prior chest x-ray 10/17/2012

CLINICAL DATA: Right anterior lower rib cage pain

CHEST - 2 VIEW

[view not recorded (1 of 2)]
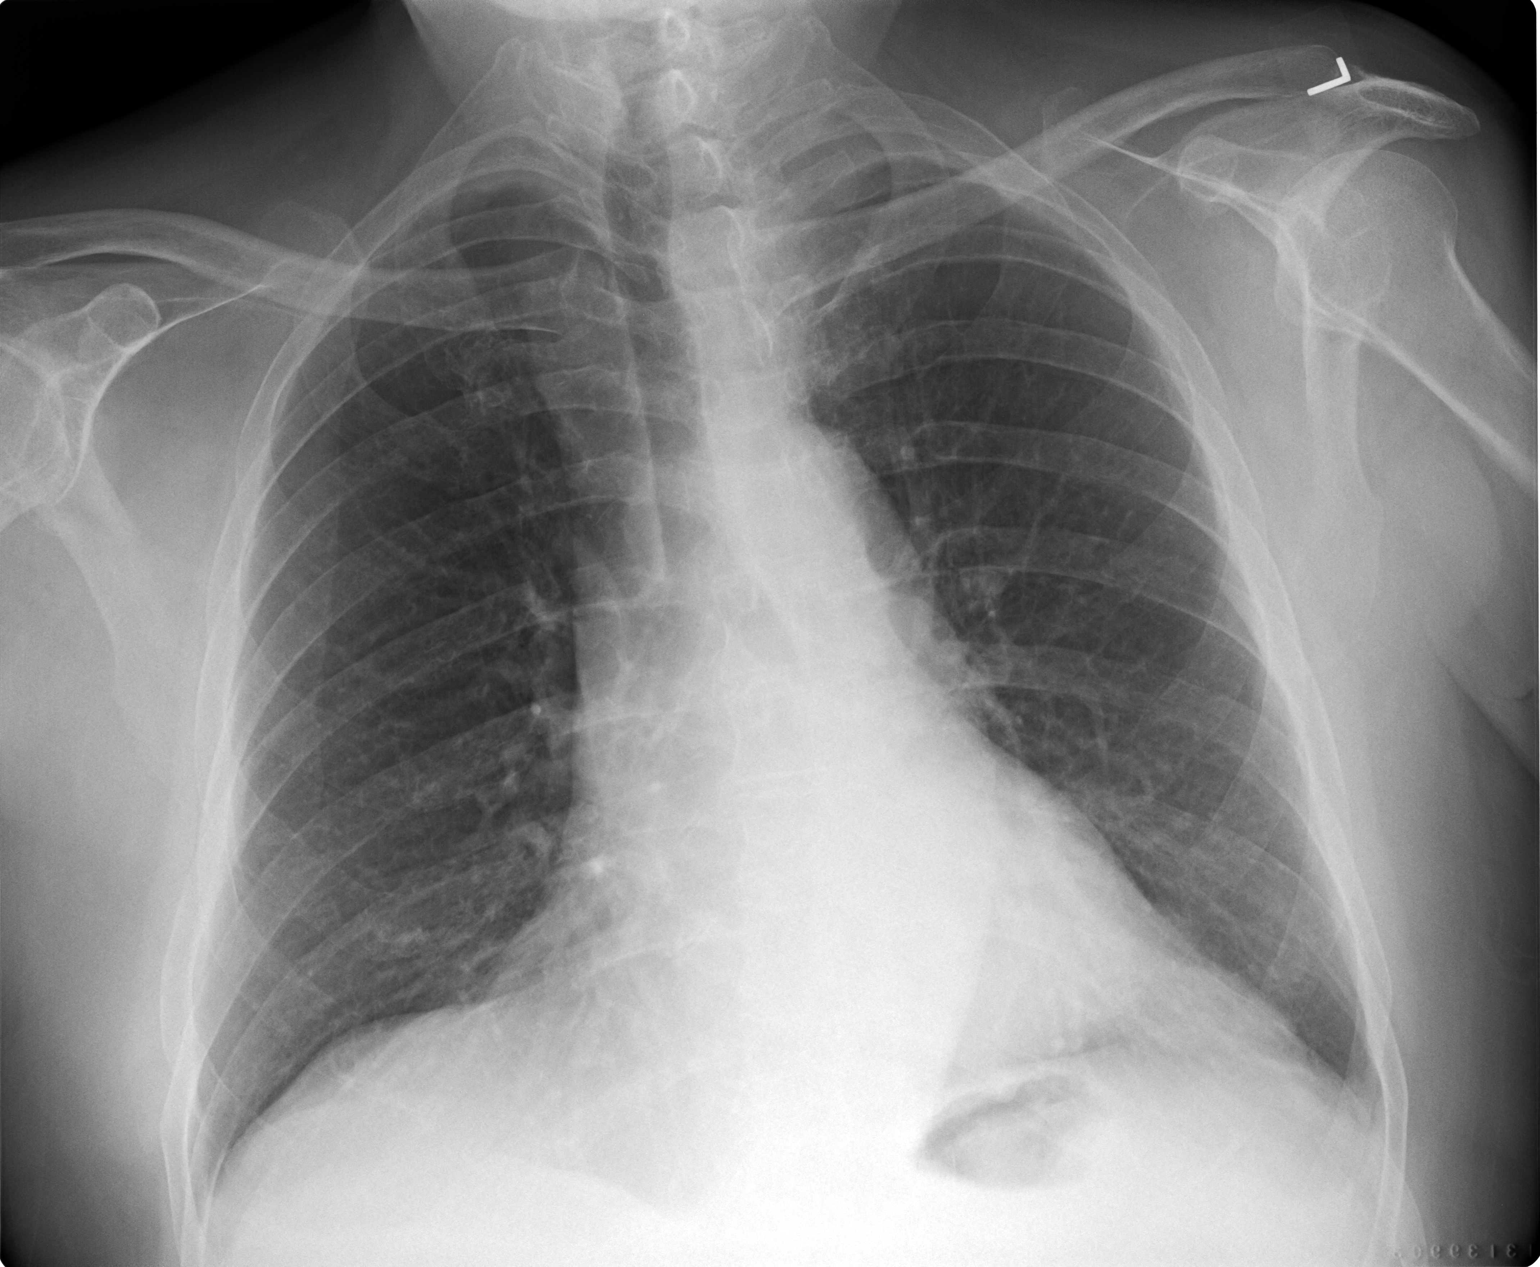

[view not recorded (2 of 2)]
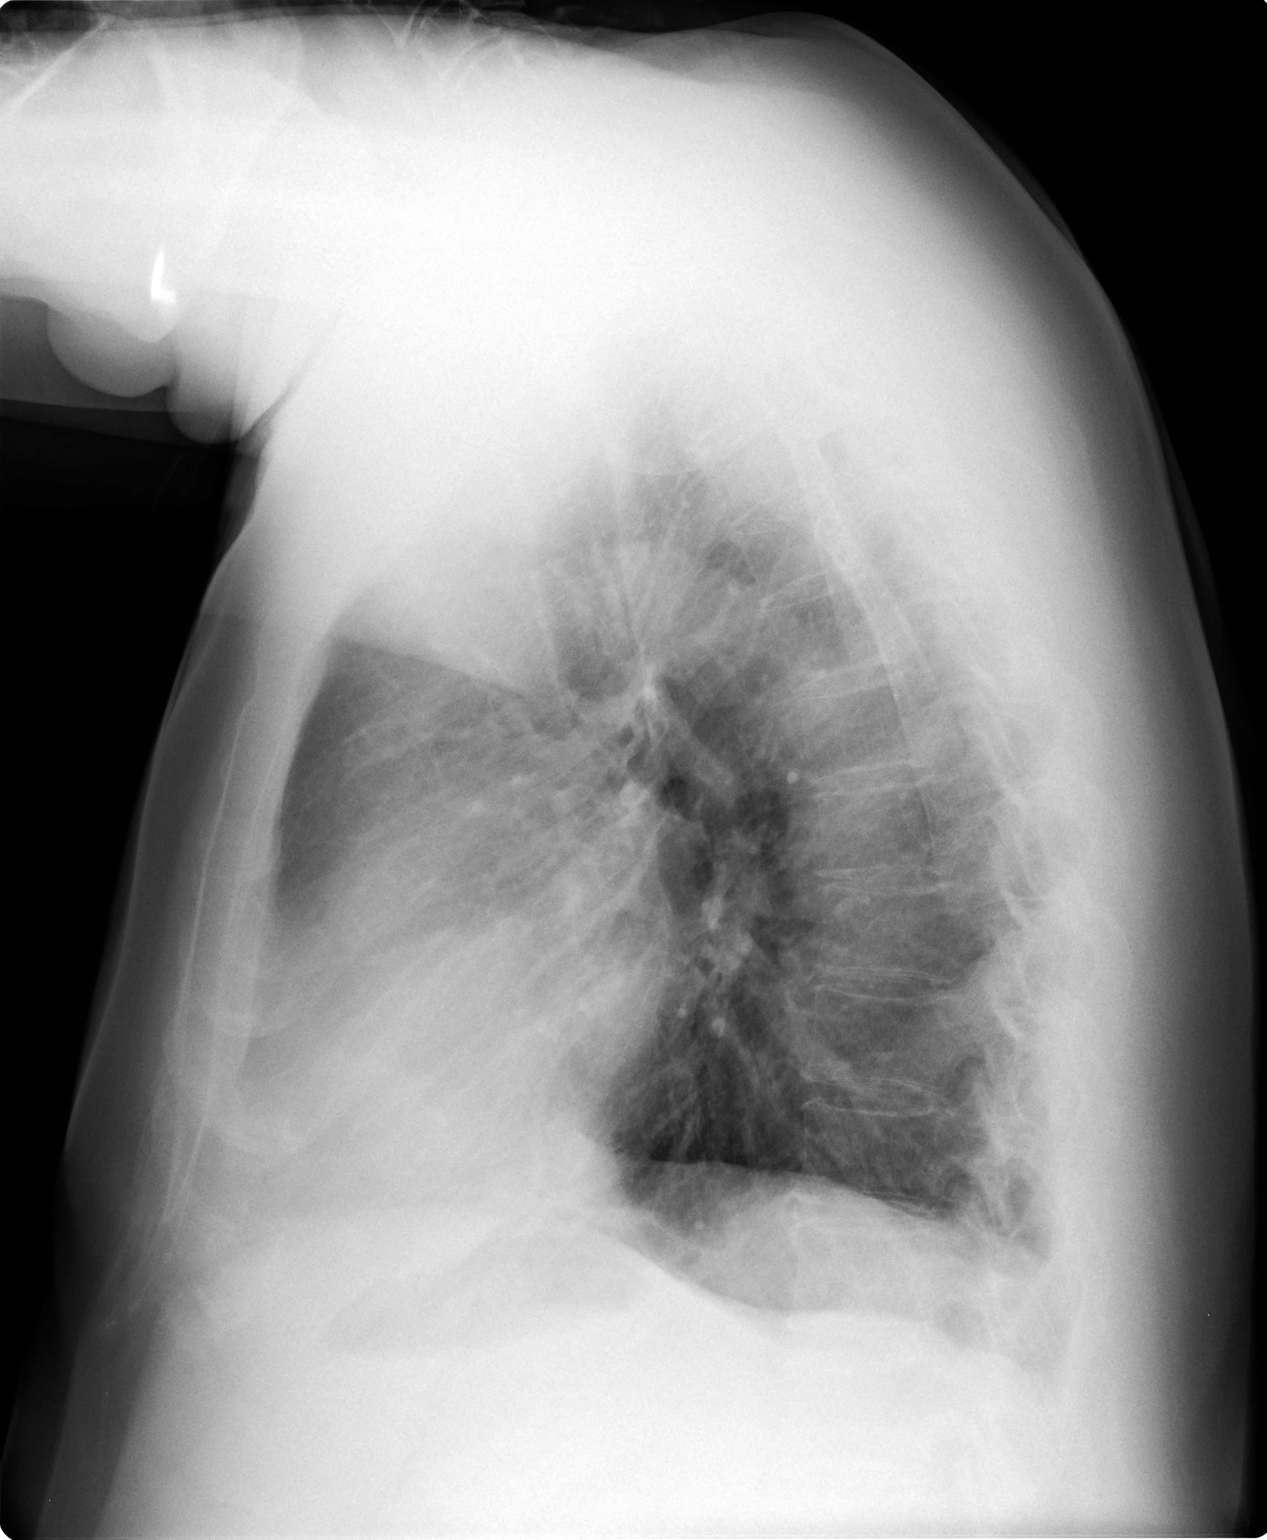

[2 of 2 positions shown; findings below may reference images not displayed]

FINDINGS: The lungs are clear.  Stable cardiac and mediastinal
contours.  No edema, focal consolidation, pleural effusion or
pneumothorax.  Stable central bronchitic changes of mild pulmonary
hyperexpansion consistent with underlying COPD.  No acute osseous
abnormality.
IMPRESSION: 1.  No acute cardiopulmonary disease.
2.  Stable changes of background COPD.

Clinically significant discrepancy from primary report, if
provided: None

## 2015-01-13 ENCOUNTER — Encounter (HOSPITAL_COMMUNITY): Payer: Self-pay | Admitting: Emergency Medicine

## 2015-01-13 ENCOUNTER — Emergency Department (HOSPITAL_COMMUNITY)
Admission: EM | Admit: 2015-01-13 | Discharge: 2015-01-13 | Disposition: A | Discharge status: Home / Self Care | Payer: Medicare Other | Attending: Emergency Medicine | Admitting: Emergency Medicine

## 2015-01-13 DIAGNOSIS — R51 Headache: Secondary | ICD-10-CM | POA: Diagnosis not present

## 2015-01-13 DIAGNOSIS — Z7982 Long term (current) use of aspirin: Secondary | ICD-10-CM | POA: Diagnosis not present

## 2015-01-13 DIAGNOSIS — Z79899 Other long term (current) drug therapy: Secondary | ICD-10-CM | POA: Diagnosis not present

## 2015-01-13 DIAGNOSIS — R2243 Localized swelling, mass and lump, lower limb, bilateral: Secondary | ICD-10-CM | POA: Diagnosis not present

## 2015-01-13 DIAGNOSIS — R040 Epistaxis: Secondary | ICD-10-CM | POA: Diagnosis not present

## 2015-01-13 DIAGNOSIS — Z87891 Personal history of nicotine dependence: Secondary | ICD-10-CM | POA: Insufficient documentation

## 2015-01-13 DIAGNOSIS — Z792 Long term (current) use of antibiotics: Secondary | ICD-10-CM | POA: Diagnosis not present

## 2015-01-13 DIAGNOSIS — Z8659 Personal history of other mental and behavioral disorders: Secondary | ICD-10-CM | POA: Insufficient documentation

## 2015-01-13 DIAGNOSIS — Z8781 Personal history of (healed) traumatic fracture: Secondary | ICD-10-CM | POA: Insufficient documentation

## 2015-01-13 DIAGNOSIS — E785 Hyperlipidemia, unspecified: Secondary | ICD-10-CM | POA: Diagnosis not present

## 2015-01-13 DIAGNOSIS — Z8669 Personal history of other diseases of the nervous system and sense organs: Secondary | ICD-10-CM | POA: Insufficient documentation

## 2015-01-13 DIAGNOSIS — M1990 Unspecified osteoarthritis, unspecified site: Secondary | ICD-10-CM | POA: Insufficient documentation

## 2015-01-13 DIAGNOSIS — R109 Unspecified abdominal pain: Secondary | ICD-10-CM | POA: Diagnosis not present

## 2015-01-13 DIAGNOSIS — M549 Dorsalgia, unspecified: Secondary | ICD-10-CM | POA: Insufficient documentation

## 2015-01-13 DIAGNOSIS — I10 Essential (primary) hypertension: Secondary | ICD-10-CM | POA: Diagnosis not present

## 2015-01-13 DIAGNOSIS — Z8709 Personal history of other diseases of the respiratory system: Secondary | ICD-10-CM | POA: Diagnosis not present

## 2015-01-13 DIAGNOSIS — E119 Type 2 diabetes mellitus without complications: Secondary | ICD-10-CM | POA: Insufficient documentation

## 2015-01-13 LAB — CBC WITH DIFFERENTIAL/PLATELET
Basophils Absolute: 0 10*3/uL (ref 0.0–0.1)
Basophils Relative: 1 % (ref 0–1)
EOS ABS: 0.1 10*3/uL (ref 0.0–0.7)
Eosinophils Relative: 1 % (ref 0–5)
HEMATOCRIT: 43.1 % (ref 39.0–52.0)
Hemoglobin: 14.9 g/dL (ref 13.0–17.0)
Lymphocytes Relative: 26 % (ref 12–46)
Lymphs Abs: 2.1 10*3/uL (ref 0.7–4.0)
MCH: 29.2 pg (ref 26.0–34.0)
MCHC: 34.6 g/dL (ref 30.0–36.0)
MCV: 84.5 fL (ref 78.0–100.0)
MONOS PCT: 8 % (ref 3–12)
Monocytes Absolute: 0.7 10*3/uL (ref 0.1–1.0)
NEUTROS ABS: 5 10*3/uL (ref 1.7–7.7)
NEUTROS PCT: 64 % (ref 43–77)
PLATELETS: 288 10*3/uL (ref 150–400)
RBC: 5.1 MIL/uL (ref 4.22–5.81)
RDW: 13.2 % (ref 11.5–15.5)
WBC: 7.9 10*3/uL (ref 4.0–10.5)

## 2015-01-13 LAB — BASIC METABOLIC PANEL
Anion gap: 4 — ABNORMAL LOW (ref 5–15)
BUN: 10 mg/dL (ref 6–23)
CHLORIDE: 110 mmol/L (ref 96–112)
CO2: 24 mmol/L (ref 19–32)
Calcium: 8.7 mg/dL (ref 8.4–10.5)
Creatinine, Ser: 0.75 mg/dL (ref 0.50–1.35)
GFR calc Af Amer: >90 mL/min (ref >90)
GFR calc non Af Amer: 88 mL/min — ABNORMAL LOW (ref >90)
Glucose, Bld: 136 mg/dL — ABNORMAL HIGH (ref 70–99)
Potassium: 3.3 mmol/L — ABNORMAL LOW (ref 3.5–5.1)
Sodium: 138 mmol/L (ref 135–145)

## 2015-01-13 NOTE — ED Notes (Signed)
Per ems pt has had 2 nosebleeds today.

## 2015-01-13 NOTE — ED Provider Notes (Signed)
CSN: [id number]     Arrival date & time 01/13/15  1928 History  This chart was scribed for Vanetta Mulders, MD by Haywood Pao, ED Scribe. The patient was seen in [facility] and the patient's care was started at 8:02 PM.  Chief Complaint  Patient presents with  . Epistaxis   Patient is a 74 y.o. male presenting with nosebleeds. The history is provided by the patient. No language interpreter was used.  Epistaxis Location:  R nare Duration:  3 hours Timing:  Sporadic Progression:  Partially resolved Chronicity:  Recurrent Relieved by:  Applying pressure, nasal tampon and ice Worsened by:  Nothing tried Associated symptoms: headaches   Associated symptoms: no congestion, no cough, no fever and no sore throat     HPI Comments: KAUAN KLOOSTERMAN is a 74 y.o. male with a history of HTN who presents to the Emergency Department complaining of epistaxis from the right nares, onset tonight. Pt stood up to go to the bathroom and began having a nose bleed. He tried using nose plugs, cold water to there bridge of the nose for no relief. So his wife called EMS and the bleeding stopped. After he stood up he stoop up again and the nose bleed persisted. Last episode was 6-7 months ago typically lasting a couple minutes. He does not take any blood thinners. Pt has not seen an ENT specialist. PCP is in [place].  Past Medical History  Diagnosis Date  . Varicose veins of legs     right   . Blurred vision   . Diabetes mellitus   . Severe headache   . Syncopal episodes     with vision change   . Leg pain   . Hyperlipidemia   . Allergic rhinitis   . Arthritis   . Diverticulitis   . BPH (benign prostatic hyperplasia)   . Hypertension   . Hyposomnia, insomnia, or sleeplessness associated with anxiety   . Dermatitis   . Lumbar stress fracture    Past Surgical History  Procedure Laterality Date  . None     Family History  Problem Relation Age of Onset  . CAD Father    History  Substance  Use Topics  . Smoking status: Former Smoker    Quit date: 11/01/1972  . Smokeless tobacco: Not on file  . Alcohol Use: No    Review of Systems  Constitutional: Positive for chills and fatigue. Negative for fever.  HENT: Positive for nosebleeds. Negative for congestion, rhinorrhea and sore throat.   Eyes: Negative for visual disturbance.  Respiratory: Negative for cough and shortness of breath.   Cardiovascular: Positive for leg swelling. Negative for chest pain.  Gastrointestinal: Positive for abdominal pain. Negative for nausea, vomiting and diarrhea.  Genitourinary: Negative for dysuria.  Musculoskeletal: Positive for back pain.  Skin: Negative for rash.  Neurological: Positive for headaches.  Hematological: Does not bruise/bleed easily.  Psychiatric/Behavioral: Negative for confusion.    Allergies  Sulfa antibiotics  Home Medications   Prior to Admission medications   Medication Sig Start Date End Date Taking? Authorizing Provider  amLODipine (NORVASC) 10 MG tablet Take 1 tablet (10 mg total) by mouth daily. 02/14/13  Yes Rollene Rotunda, MD  aspirin 81 MG chewable tablet Chew 81 mg by mouth daily.    Yes Historical Provider, MD  atorvastatin (LIPITOR) 40 MG tablet Take 40 mg by mouth daily.   Yes Historical Provider, MD  ciprofloxacin (CIPRO) 500 MG tablet Take 1 tablet (500 mg total) by  mouth 2 (two) times daily. Patient taking differently: Take 500 mg by mouth 2 (two) times daily. AS NEEDED FOR DIVERTICULITIS 05/20/13  Yes Vanetta Mulders, MD  clonazePAM (KLONOPIN) 1 MG tablet Take 1 mg by mouth at bedtime.   Yes Historical Provider, MD  clotrimazole-betamethasone (LOTRISONE) cream Apply 1 application topically at bedtime. Applied to groin area at bedtime 03/08/13  Yes Mary-Margaret Daphine Deutscher, FNP  finasteride (PROSCAR) 5 MG tablet Take 5 mg by mouth daily.   Yes Historical Provider, MD  glimepiride (AMARYL) 4 MG tablet Take 4 mg by mouth 2 (two) times daily. 03/08/13  Yes  Mary-Margaret Daphine Deutscher, FNP  lisinopril (PRINIVIL,ZESTRIL) 20 MG tablet Take 1 tablet (20 mg total) by mouth daily. 05/21/13  Yes Mae Shelda Altes, FNP  lubiprostone (AMITIZA) 24 MCG capsule Take 24 mcg by mouth daily with breakfast.   Yes Historical Provider, MD  metoprolol tartrate (LOPRESSOR) 25 MG tablet Take 25 mg by mouth daily.   Yes Historical Provider, MD  metoprolol tartrate (LOPRESSOR) 25 MG tablet TAKE 1 TABLET (25 MG TOTAL) BY MOUTH 2 (TWO) TIMES DAILY. 05/24/13  Yes Ileana Ladd, MD  montelukast (SINGULAIR) 10 MG tablet TAKE 1 TABLET BY MOUTH AT BEDTIME 06/11/13  Yes Mary-Margaret Daphine Deutscher, FNP  oxyCODONE-acetaminophen (PERCOCET) 7.5-325 MG per tablet Take 1 tablet by mouth every 8 (eight) hours as needed for pain.   Yes Historical Provider, MD  terazosin (HYTRIN) 10 MG capsule TAKE 1 CAPSULE TWICE A DAY 07/04/13  Yes Mary-Margaret Daphine Deutscher, FNP  cyclobenzaprine (FLEXERIL) 5 MG tablet Take 1 tablet (5 mg total) by mouth 3 (three) times daily as needed for muscle spasms. 04/26/13   Mary-Margaret Daphine Deutscher, FNP  polyethylene glycol powder (GLYCOLAX/MIRALAX) powder Take 17 g by mouth 2 (two) times daily. 05/09/13   Carleene Cooper, MD  traMADol (ULTRAM) 50 MG tablet Take 50 mg by mouth every 6 (six) hours as needed for pain.    Historical Provider, MD   BP 150/84 mmHg  Pulse 101  Temp(Src) 97.5 F (36.4 C) (Oral)  Resp 15  Ht  (1.778 m)  Wt 208 lb (94.348 kg)  BMI 29.84 kg/m2  SpO2 91% Physical Exam  Constitutional: He is oriented to person, place, and time.  HENT:  Head: Normocephalic and atraumatic.  Mouth/Throat: Mucous membranes are normal.  No postnasal drainage. Dry blood on the left nares, no active or dry blood in the right nares.  Eyes: Conjunctivae and EOM are normal. Pupils are equal, round, and reactive to light. No scleral icterus.  Cardiovascular: Normal rate and regular rhythm.   No murmur heard. Pulmonary/Chest: Effort normal and breath sounds normal.  Abdominal:  Bowel sounds are normal. There is no tenderness.  Musculoskeletal: He exhibits edema.  Bilateral leg swelling.  Neurological: He is alert and oriented to person, place, and time. He has normal reflexes. No cranial nerve deficit. He exhibits normal muscle tone. Coordination normal.  Nursing note and vitals reviewed.   ED Course  Procedures  DIAGNOSTIC STUDIES: Oxygen Saturation is 98% on room air, normal by my interpretation.    COORDINATION OF CARE: 8:15 PM Discussed treatment plan with pt at bedside and pt agreed to plan.  Labs Review Labs Reviewed  BASIC METABOLIC PANEL - Abnormal; Notable for the following:    Potassium 3.3 (*)    Glucose, Bld 136 (*)    GFR calc non Af Amer 88 (*)    Anion gap 4 (*)    All other components within normal limits  CBC  WITH DIFFERENTIAL/PLATELET   Results for orders placed or performed during the hospital encounter of 01/13/15  Basic metabolic panel  Result Value Ref Range   Sodium 138 135 - 145 mmol/L   Potassium 3.3 (L) 3.5 - 5.1 mmol/L   Chloride 110 96 - 112 mmol/L   CO2 24 19 - 32 mmol/L   Glucose, Bld 136 (H) 70 - 99 mg/dL   BUN 10 6 - 23 mg/dL   Creatinine, Ser 1.610.75 0.50 - 1.35 mg/dL   Calcium 8.7 8.4 - 09.610.5 mg/dL   GFR calc non Af Amer 88 (L) >90 mL/min   GFR calc Af Amer >90 >90 mL/min   Anion gap 4 (L) 5 - 15  CBC with Differential/Platelet  Result Value Ref Range   WBC 7.9 4.0 - 10.5 K/uL   RBC 5.10 4.22 - 5.81 MIL/uL   Hemoglobin 14.9 13.0 - 17.0 g/dL   HCT 04.543.1 40.939.0 - 81.152.0 %   MCV 84.5 78.0 - 100.0 fL   MCH 29.2 26.0 - 34.0 pg   MCHC 34.6 30.0 - 36.0 g/dL   RDW 91.413.2 78.211.5 - 95.615.5 %   Platelets 288 150 - 400 K/uL   Neutrophils Relative % 64 43 - 77 %   Neutro Abs 5.0 1.7 - 7.7 K/uL   Lymphocytes Relative 26 12 - 46 %   Lymphs Abs 2.1 0.7 - 4.0 K/uL   Monocytes Relative 8 3 - 12 %   Monocytes Absolute 0.7 0.1 - 1.0 K/uL   Eosinophils Relative 1 0 - 5 %   Eosinophils Absolute 0.1 0.0 - 0.7 K/uL   Basophils Relative 1  0 - 1 %   Basophils Absolute 0.0 0.0 - 0.1 K/uL     Imaging Review No results found.   EKG Interpretation   Date/Time:  Monday January 13 2015 20:45:15 EST Ventricular Rate:  98 PR Interval:  174 QRS Duration: 138 QT Interval:  393 QTC Calculation: 502 R Axis:   -64 Text Interpretation:  Sinus tachycardia RBBB and LAFB No previous ECGs  available Confirmed by Trampus Mcquerry  MD, Piera Downs ([id number]) on 01/13/2015 8:50:35  PM      MDM   Final diagnoses:  Epistaxis   Patient with a nosebleed 2 from the right nares. No bleeding currently. No significant blood loss based on labs. Follow up with ear nose and throat arranged. Patient given instructions on when to return and treatment they can be given at home if the nose starts bleeding again.  I personally performed the services described in this documentation, which was scribed in my presence. The recorded information has been reviewed and is accurate.       Vanetta MuldersScott Royden Bulman, MD 01/13/15 [phone]

## 2015-01-13 NOTE — Discharge Instructions (Signed)
Nosebleed A nosebleed can be caused by many things, including:  Getting hit hard in the nose.  Infections.  Dry nose.  Colds.  Medicines. Your doctor may do lab testing if you get nosebleeds a lot and the cause is not known. HOME CARE   If your nose was packed with material, keep it there until your doctor takes it out. Put the pack back in your nose if the pack falls out.  Do not blow your nose for 12 hours after the nosebleed.  Sit up and bend forward if your nose starts bleeding again. Pinch the front half of your nose nonstop for 20 minutes.  Put petroleum jelly inside your nose every morning if you have a dry nose.  Use a humidifier to make the air less dry.  Do not take aspirin.  Try not to strain, lift, or bend at the waist for many days after the nosebleed. GET HELP RIGHT AWAY IF:   Nosebleeds keep happening and are hard to stop or control.  You have bleeding or bruises that are not normal on other parts of the body.  You have a fever.  The nosebleeds get worse.  You get lightheaded, feel faint, sweaty, or throw up (vomit) blood. MAKE SURE YOU:   Understand these instructions.  Will watch your condition.  Will get help right away if you are not doing well or get worse. Document Released: 08/10/2008 Document Revised: 01/24/2012 Document Reviewed: 08/10/2008 [facility] Patient Information 2015 [place], [place]. This information is not intended to replace advice given to you by your health care provider. Make sure you discuss any questions you have with your health care provider.  Follow the instructions we discussed if the nose starts to rebleed with pinching it for 20 minutes. Call ear nose and throat for follow-up. Address and phone number provided. Return for any new or worse symptoms.

## 2015-04-22 DIAGNOSIS — M545 Low back pain: Secondary | ICD-10-CM | POA: Diagnosis not present

## 2015-05-12 ENCOUNTER — Other Ambulatory Visit: Payer: Self-pay

## 2015-06-11 DIAGNOSIS — L03114 Cellulitis of left upper limb: Secondary | ICD-10-CM | POA: Diagnosis not present

## 2015-06-26 DIAGNOSIS — E1140 Type 2 diabetes mellitus with diabetic neuropathy, unspecified: Secondary | ICD-10-CM | POA: Diagnosis not present

## 2015-06-27 DIAGNOSIS — E1140 Type 2 diabetes mellitus with diabetic neuropathy, unspecified: Secondary | ICD-10-CM | POA: Diagnosis not present

## 2015-07-02 ENCOUNTER — Encounter (HOSPITAL_COMMUNITY): Payer: Self-pay | Admitting: Emergency Medicine

## 2015-07-02 ENCOUNTER — Emergency Department (HOSPITAL_COMMUNITY)
Admission: EM | Admit: 2015-07-02 | Discharge: 2015-07-03 | Disposition: A | Discharge status: Home / Self Care | Payer: Medicare Other | Attending: Physician Assistant | Admitting: Physician Assistant

## 2015-07-02 ENCOUNTER — Emergency Department (HOSPITAL_COMMUNITY): Payer: Medicare Other

## 2015-07-02 DIAGNOSIS — Y929 Unspecified place or not applicable: Secondary | ICD-10-CM | POA: Diagnosis not present

## 2015-07-02 DIAGNOSIS — Z87891 Personal history of nicotine dependence: Secondary | ICD-10-CM | POA: Insufficient documentation

## 2015-07-02 DIAGNOSIS — E119 Type 2 diabetes mellitus without complications: Secondary | ICD-10-CM | POA: Diagnosis not present

## 2015-07-02 DIAGNOSIS — Z8719 Personal history of other diseases of the digestive system: Secondary | ICD-10-CM | POA: Insufficient documentation

## 2015-07-02 DIAGNOSIS — E785 Hyperlipidemia, unspecified: Secondary | ICD-10-CM | POA: Diagnosis not present

## 2015-07-02 DIAGNOSIS — R339 Retention of urine, unspecified: Secondary | ICD-10-CM | POA: Diagnosis not present

## 2015-07-02 DIAGNOSIS — Z87438 Personal history of other diseases of male genital organs: Secondary | ICD-10-CM | POA: Diagnosis not present

## 2015-07-02 DIAGNOSIS — Z8669 Personal history of other diseases of the nervous system and sense organs: Secondary | ICD-10-CM | POA: Diagnosis not present

## 2015-07-02 DIAGNOSIS — Y939 Activity, unspecified: Secondary | ICD-10-CM | POA: Diagnosis not present

## 2015-07-02 DIAGNOSIS — I10 Essential (primary) hypertension: Secondary | ICD-10-CM | POA: Diagnosis not present

## 2015-07-02 DIAGNOSIS — S60812A Abrasion of left wrist, initial encounter: Secondary | ICD-10-CM | POA: Diagnosis not present

## 2015-07-02 DIAGNOSIS — R05 Cough: Secondary | ICD-10-CM | POA: Insufficient documentation

## 2015-07-02 DIAGNOSIS — Z8781 Personal history of (healed) traumatic fracture: Secondary | ICD-10-CM | POA: Insufficient documentation

## 2015-07-02 DIAGNOSIS — R1084 Generalized abdominal pain: Secondary | ICD-10-CM | POA: Diagnosis present

## 2015-07-02 DIAGNOSIS — Z8659 Personal history of other mental and behavioral disorders: Secondary | ICD-10-CM | POA: Insufficient documentation

## 2015-07-02 DIAGNOSIS — Z872 Personal history of diseases of the skin and subcutaneous tissue: Secondary | ICD-10-CM | POA: Diagnosis not present

## 2015-07-02 DIAGNOSIS — R5383 Other fatigue: Secondary | ICD-10-CM | POA: Insufficient documentation

## 2015-07-02 DIAGNOSIS — N3289 Other specified disorders of bladder: Secondary | ICD-10-CM | POA: Diagnosis not present

## 2015-07-02 DIAGNOSIS — Z7982 Long term (current) use of aspirin: Secondary | ICD-10-CM | POA: Insufficient documentation

## 2015-07-02 DIAGNOSIS — M1990 Unspecified osteoarthritis, unspecified site: Secondary | ICD-10-CM | POA: Diagnosis not present

## 2015-07-02 DIAGNOSIS — Y999 Unspecified external cause status: Secondary | ICD-10-CM | POA: Insufficient documentation

## 2015-07-02 DIAGNOSIS — R630 Anorexia: Secondary | ICD-10-CM | POA: Diagnosis not present

## 2015-07-02 DIAGNOSIS — N200 Calculus of kidney: Secondary | ICD-10-CM | POA: Diagnosis not present

## 2015-07-02 DIAGNOSIS — X58XXXA Exposure to other specified factors, initial encounter: Secondary | ICD-10-CM | POA: Diagnosis not present

## 2015-07-02 DIAGNOSIS — R110 Nausea: Secondary | ICD-10-CM | POA: Diagnosis not present

## 2015-07-02 DIAGNOSIS — Z79899 Other long term (current) drug therapy: Secondary | ICD-10-CM | POA: Diagnosis not present

## 2015-07-02 LAB — LIPASE, BLOOD: Lipase: 21 U/L — ABNORMAL LOW (ref 22–51)

## 2015-07-02 LAB — COMPREHENSIVE METABOLIC PANEL
ALK PHOS: 82 U/L (ref 38–126)
ALT: 25 U/L (ref 17–63)
AST: 29 U/L (ref 15–41)
Albumin: 4 g/dL (ref 3.5–5.0)
Anion gap: 10 (ref 5–15)
BUN: 10 mg/dL (ref 6–20)
CHLORIDE: 104 mmol/L (ref 101–111)
CO2: 24 mmol/L (ref 22–32)
CREATININE: 0.74 mg/dL (ref 0.61–1.24)
Calcium: 9.1 mg/dL (ref 8.9–10.3)
Glucose, Bld: 141 mg/dL — ABNORMAL HIGH (ref 65–99)
Potassium: 3.6 mmol/L (ref 3.5–5.1)
Sodium: 138 mmol/L (ref 135–145)
Total Bilirubin: 1 mg/dL (ref 0.3–1.2)
Total Protein: 7.4 g/dL (ref 6.5–8.1)

## 2015-07-02 LAB — CBC WITH DIFFERENTIAL/PLATELET
Basophils Absolute: 0 10*3/uL (ref 0.0–0.1)
Basophils Relative: 1 % (ref 0–1)
EOS ABS: 0.1 10*3/uL (ref 0.0–0.7)
Eosinophils Relative: 2 % (ref 0–5)
HCT: 45.2 % (ref 39.0–52.0)
Hemoglobin: 15.8 g/dL (ref 13.0–17.0)
Lymphocytes Relative: 29 % (ref 12–46)
Lymphs Abs: 2.4 10*3/uL (ref 0.7–4.0)
MCH: 29.8 pg (ref 26.0–34.0)
MCHC: 35 g/dL (ref 30.0–36.0)
MCV: 85.3 fL (ref 78.0–100.0)
MONO ABS: 0.6 10*3/uL (ref 0.1–1.0)
MONOS PCT: 7 % (ref 3–12)
Neutro Abs: 5.2 10*3/uL (ref 1.7–7.7)
Neutrophils Relative %: 63 % (ref 43–77)
PLATELETS: 241 10*3/uL (ref 150–400)
RBC: 5.3 MIL/uL (ref 4.22–5.81)
RDW: 13.3 % (ref 11.5–15.5)
WBC: 8.3 10*3/uL (ref 4.0–10.5)

## 2015-07-02 LAB — URINALYSIS, ROUTINE W REFLEX MICROSCOPIC
BILIRUBIN URINE: NEGATIVE
GLUCOSE, UA: NEGATIVE mg/dL
HGB URINE DIPSTICK: NEGATIVE
Leukocytes, UA: NEGATIVE
Nitrite: NEGATIVE
PH: 7 (ref 5.0–8.0)
Protein, ur: NEGATIVE mg/dL
SPECIFIC GRAVITY, URINE: 1.01 (ref 1.005–1.030)
Urobilinogen, UA: 0.2 mg/dL (ref 0.0–1.0)

## 2015-07-02 MED ORDER — ONDANSETRON HCL 4 MG/2ML IJ SOLN
4.0000 mg | Freq: Once | INTRAMUSCULAR | Status: AC
  Administered 2015-07-02: 4 mg via INTRAVENOUS
  Filled 2015-07-02: qty 2

## 2015-07-02 MED ORDER — IOHEXOL 300 MG/ML  SOLN
25.0000 mL | Freq: Once | INTRAMUSCULAR | Status: AC | PRN
  Administered 2015-07-02: 25 mL via ORAL

## 2015-07-02 MED ORDER — MORPHINE SULFATE (PF) 4 MG/ML IV SOLN
4.0000 mg | Freq: Once | INTRAVENOUS | Status: AC
  Administered 2015-07-02: 4 mg via INTRAVENOUS
  Filled 2015-07-02: qty 1

## 2015-07-02 MED ORDER — IOHEXOL 300 MG/ML  SOLN
100.0000 mL | Freq: Once | INTRAMUSCULAR | Status: AC | PRN
  Administered 2015-07-02: 100 mL via INTRAVENOUS

## 2015-07-02 NOTE — ED Provider Notes (Signed)
CSN: [id number]     Arrival date & time 07/02/15  2038 History  This chart was scribed for Courteney Randall An, MD by Placido Sou, ED scribe. This patient was seen in room APA02/APA02 and the patient's care was started at 9:23 PM.   Chief Complaint  Patient presents with  . Abdominal Pain   The history is provided by the patient and the spouse. No language interpreter was used.    HPI Comments: Omar Porter is a 74 y.o. male, with a hx of diverticulitis, who presents to the Emergency Department complaining of gradual onset, worsening, moderate, diffuse, centralized abd pain with onset 4 days ago. He notes associated lack of appetite, fatigue, intermittent dysuria during the morning and night, intermittent cough at night and nausea. Pt notes worsening pain when palpating his umbilical region or s/p food or drink. He denies diarrhea.   Past Medical History  Diagnosis Date  . Varicose veins of legs     right   . Blurred vision   . Diabetes mellitus   . Severe headache   . Syncopal episodes     with vision change   . Leg pain   . Hyperlipidemia   . Allergic rhinitis   . Arthritis   . Diverticulitis   . BPH (benign prostatic hyperplasia)   . Hypertension   . Hyposomnia, insomnia, or sleeplessness associated with anxiety   . Dermatitis   . Lumbar stress fracture    Past Surgical History  Procedure Laterality Date  . None     Family History  Problem Relation Age of Onset  . CAD Father    Social History  Substance Use Topics  . Smoking status: Former Smoker    Quit date: 11/01/1972  . Smokeless tobacco: None  . Alcohol Use: No    Review of Systems  Constitutional: Positive for appetite change and fatigue.  Respiratory: Positive for cough.   Gastrointestinal: Positive for nausea and abdominal pain. Negative for vomiting and diarrhea.  Genitourinary: Positive for dysuria.  All other systems reviewed and are negative.  Allergies  Sulfa antibiotics  Home  Medications   Prior to Admission medications   Medication Sig Start Date End Date Taking? Authorizing Provider  amLODipine (NORVASC) 10 MG tablet Take 1 tablet (10 mg total) by mouth daily. 02/14/13   Rollene Rotunda, MD  aspirin 81 MG chewable tablet Chew 81 mg by mouth daily.     Historical Provider, MD  atorvastatin (LIPITOR) 40 MG tablet Take 40 mg by mouth daily.    Historical Provider, MD  ciprofloxacin (CIPRO) 500 MG tablet Take 1 tablet (500 mg total) by mouth 2 (two) times daily. Patient taking differently: Take 500 mg by mouth 2 (two) times daily. AS NEEDED FOR DIVERTICULITIS 05/20/13   Vanetta Mulders, MD  clonazePAM (KLONOPIN) 1 MG tablet Take 1 mg by mouth at bedtime.    Historical Provider, MD  clotrimazole-betamethasone (LOTRISONE) cream Apply 1 application topically at bedtime. Applied to groin area at bedtime 03/08/13   Mary-Margaret Daphine Deutscher, FNP  cyclobenzaprine (FLEXERIL) 5 MG tablet Take 1 tablet (5 mg total) by mouth 3 (three) times daily as needed for muscle spasms. 04/26/13   Mary-Margaret Daphine Deutscher, FNP  finasteride (PROSCAR) 5 MG tablet Take 5 mg by mouth daily.    Historical Provider, MD  glimepiride (AMARYL) 4 MG tablet Take 4 mg by mouth 2 (two) times daily. 03/08/13   Mary-Margaret Daphine Deutscher, FNP  lisinopril (PRINIVIL,ZESTRIL) 20 MG tablet Take 1 tablet (20 mg total)  by mouth daily. 05/21/13   Coralie Keens, FNP  lubiprostone (AMITIZA) 24 MCG capsule Take 24 mcg by mouth daily with breakfast.    Historical Provider, MD  metoprolol tartrate (LOPRESSOR) 25 MG tablet Take 25 mg by mouth daily.    Historical Provider, MD  metoprolol tartrate (LOPRESSOR) 25 MG tablet TAKE 1 TABLET (25 MG TOTAL) BY MOUTH 2 (TWO) TIMES DAILY. 05/24/13   Ileana Ladd, MD  montelukast (SINGULAIR) 10 MG tablet TAKE 1 TABLET BY MOUTH AT BEDTIME 06/11/13   Mary-Margaret Daphine Deutscher, FNP  oxyCODONE-acetaminophen (PERCOCET) 7.5-325 MG per tablet Take 1 tablet by mouth every 8 (eight) hours as needed for pain.     Historical Provider, MD  polyethylene glycol powder (GLYCOLAX/MIRALAX) powder Take 17 g by mouth 2 (two) times daily. 05/09/13   Carleene Cooper, MD  terazosin (HYTRIN) 10 MG capsule TAKE 1 CAPSULE TWICE A DAY 07/04/13   Mary-Margaret Daphine Deutscher, FNP  traMADol (ULTRAM) 50 MG tablet Take 50 mg by mouth every 6 (six) hours as needed for pain.    Historical Provider, MD   BP 140/68 mmHg  Pulse 77  Temp(Src) 97.7 F (36.5 C) (Oral)  Resp 16  Ht 5\' 11"  (1.803 m)  Wt 197 lb (89.359 kg)  BMI 27.49 kg/m2  SpO2 97% Physical Exam  Constitutional: He is oriented to person, place, and time. He appears well-developed and well-nourished. No distress.  HENT:  Head: Normocephalic and atraumatic.  Mouth/Throat: No oropharyngeal exudate.  Dry mucus membranes  Eyes: Right eye exhibits no discharge. Left eye exhibits no discharge.  Neck: Normal range of motion. No tracheal deviation present.  Cardiovascular: Normal rate, regular rhythm and normal heart sounds.  Exam reveals no gallop and no friction rub.   No murmur heard. Pulmonary/Chest: Effort normal. No respiratory distress.  Abdominal: Soft. Bowel sounds are normal. There is no tenderness.  Musculoskeletal: Normal range of motion. He exhibits edema.  +1 edema of bilateral legs  Neurological: He is alert and oriented to person, place, and time. No cranial nerve deficit.  Cranial nerves 2-12 intact  Skin: Skin is warm and dry. He is not diaphoretic.  Minor abrasion to left wrist  Psychiatric: He has a normal mood and affect. His behavior is normal.  Nursing note and vitals reviewed.  ED Course  Procedures  DIAGNOSTIC STUDIES: Oxygen Saturation is 97% on RA, normal by my interpretation.    COORDINATION OF CARE: 9:30 PM Discussed treatment plan with pt at bedside and pt agreed to plan.  Labs Review Labs Reviewed - No data to display  Imaging Review Ct Abdomen Pelvis W Contrast  07/02/2015   CLINICAL DATA:  Periumbilical pain and fatigue  EXAM:  CT ABDOMEN AND PELVIS WITH CONTRAST  TECHNIQUE: Multidetector CT imaging of the abdomen and pelvis was performed using the standard protocol following bolus administration of intravenous contrast.  CONTRAST:  25mL OMNIPAQUE IOHEXOL 300 MG/ML SOLN, OMNIPAQUE IOHEXOL 300 MG/ML SOLN  COMPARISON:  05/20/2013  FINDINGS: BODY WALL: Fatty umbilical hernia. Small fatty bilateral inguinal hernias.  LOWER CHEST: No contributory findings.  ABDOMEN/PELVIS:  Liver: No focal abnormality.  Biliary: No evidence of biliary obstruction or stone.  Pancreas: Unremarkable.  Spleen: Unremarkable.  Adrenals: Unremarkable.  Kidneys and ureters: 3 left renal calculi measuring up to 5 mm. No hydronephrosis or ureteral stone. Mild left renal cortical scarring near a lower pole stone.  Bladder: Moderately distended with mild wall thickening.  Reproductive: Stable enlargement of prostate, projecting into the bladder.  Bowel:  Extensive distal colonic diverticulosis without inflammation. No bowel obstruction. No appendicitis.  Retroperitoneum: No mass or adenopathy.  Peritoneum: No ascites or pneumoperitoneum.  Vascular: No acute abnormality.  OSSEOUS: Remote endplate fractures of T11, L1, L2, L3, and L4. The T12 and L2 fractures have developed since 2014.  IMPRESSION: 1. No acute finding. 2. Distended urinary bladder, likely chronic outlet obstruction in this patient with prostatomegaly. 3. Nonobstructive left nephrolithiasis. 4. Extensive colonic diverticulosis.   Electronically Signed   By: Marnee Spring M.D.   On: 07/02/2015 23:09   I have personally reviewed and evaluated these images and lab results as part of my medical decision-making.   EKG Interpretation   Date/Time:  Wednesday July 02 2015 21:46:27 EDT Ventricular Rate:  65 PR Interval:  184 QRS Duration: 97 QT Interval:  453 QTC Calculation: 471 R Axis:   -59 Text Interpretation:  Sinus rhythm Inferior infarct, old Consider anterior  infarct t wave incversions  V1-V3 no acute ischemia. Confirmed by Kandis Mannan ([id number]) on 07/02/2015 10:17:49 PM      MDM   Final diagnoses:  None    Patient is a pleasant 74 year old male presenting with vague abdominal pain. Patient states that he has been increasingly sleepy at home and having vague pain in his abdomen. We will look for evidence of urinary tract infection today. If there is no UTI we will get CT scan of his abdomen.  11:46 PM CT shows bladder distention. Indeed after urination patient still had over 1000 mL in his bladder. We will give him a Foley and have him follow up with urology in 5 days.  I personally performed the services described in this documentation, which was scribed in my presence. The recorded information has been reviewed and is accurate.   Courteney Randall An, MD 07/02/15 [phone]

## 2015-07-02 NOTE — Discharge Instructions (Signed)
Acute Urinary Retention  Acute urinary retention is when you are unable to pee (urinate). Acute urinary retention is common in older men. Prostates can get bigger, which blocks the flow of pee.   HOME CARE  · Drink enough fluids to keep your pee clear or pale yellow.  · If you are sent home with a tube that drains the bladder (catheter), there will be a drainage bag attached to it. There are two types of bags. One is big that you can wear at night without having to empty it. One is smaller and needs to be emptied more often.  ¨ Keep the drainage bag empty.  ¨ Keep the drainage bag lower than your catheter.  · Only take medicine as told by your doctor.  GET HELP IF:  · You have a low-grade fever.  · You have spasms or you are leaking pee when you have spasms.  GET HELP RIGHT AWAY IF:   · You have chills or a fever.  · Your catheter stops draining pee.  · Your catheter falls out.  · You have increased bleeding that does not stop after you have rested and increased the amount of fluids you had been drinking.  MAKE SURE YOU:   · Understand these instructions.  · Will watch your condition.  · Will get help right away if you are not doing well or get worse.  Document Released: 04/19/2008 Document Revised: 08/22/2013 Document Reviewed: 04/12/2013  ExitCare® Patient Information ©2015 Exit[employer], LLC. This information is not intended to replace advice given to you by your health care provider. Make sure you discuss any questions you have with your health care provider.

## 2015-07-02 NOTE — ED Notes (Signed)
Pt c/o abd pain and denies any n/v/d. Pt also c/o poor po intake and unsteady gait x 2 weeks.

## 2015-07-03 NOTE — ED Notes (Signed)
Pt left with foley cath and leg bag to follow up with urology.  States understanding of care given and follow up instructions

## 2015-07-04 ENCOUNTER — Emergency Department (HOSPITAL_COMMUNITY)
Admission: EM | Admit: 2015-07-04 | Discharge: 2015-07-04 | Disposition: A | Discharge status: Home / Self Care | Payer: Medicare Other | Attending: Emergency Medicine | Admitting: Emergency Medicine

## 2015-07-04 ENCOUNTER — Encounter (HOSPITAL_COMMUNITY): Payer: Self-pay | Admitting: Emergency Medicine

## 2015-07-04 DIAGNOSIS — Z872 Personal history of diseases of the skin and subcutaneous tissue: Secondary | ICD-10-CM | POA: Diagnosis not present

## 2015-07-04 DIAGNOSIS — E785 Hyperlipidemia, unspecified: Secondary | ICD-10-CM | POA: Diagnosis not present

## 2015-07-04 DIAGNOSIS — I10 Essential (primary) hypertension: Secondary | ICD-10-CM | POA: Diagnosis not present

## 2015-07-04 DIAGNOSIS — T83091A Other mechanical complication of indwelling urethral catheter, initial encounter: Secondary | ICD-10-CM

## 2015-07-04 DIAGNOSIS — N390 Urinary tract infection, site not specified: Secondary | ICD-10-CM

## 2015-07-04 DIAGNOSIS — M1990 Unspecified osteoarthritis, unspecified site: Secondary | ICD-10-CM | POA: Diagnosis not present

## 2015-07-04 DIAGNOSIS — Z79899 Other long term (current) drug therapy: Secondary | ICD-10-CM | POA: Insufficient documentation

## 2015-07-04 DIAGNOSIS — T83098A Other mechanical complication of other indwelling urethral catheter, initial encounter: Secondary | ICD-10-CM | POA: Insufficient documentation

## 2015-07-04 DIAGNOSIS — Z87891 Personal history of nicotine dependence: Secondary | ICD-10-CM | POA: Insufficient documentation

## 2015-07-04 DIAGNOSIS — Z8669 Personal history of other diseases of the nervous system and sense organs: Secondary | ICD-10-CM | POA: Insufficient documentation

## 2015-07-04 DIAGNOSIS — Z87438 Personal history of other diseases of male genital organs: Secondary | ICD-10-CM | POA: Insufficient documentation

## 2015-07-04 DIAGNOSIS — Z7982 Long term (current) use of aspirin: Secondary | ICD-10-CM | POA: Diagnosis not present

## 2015-07-04 DIAGNOSIS — E119 Type 2 diabetes mellitus without complications: Secondary | ICD-10-CM | POA: Diagnosis not present

## 2015-07-04 DIAGNOSIS — Z8781 Personal history of (healed) traumatic fracture: Secondary | ICD-10-CM | POA: Insufficient documentation

## 2015-07-04 DIAGNOSIS — Y846 Urinary catheterization as the cause of abnormal reaction of the patient, or of later complication, without mention of misadventure at the time of the procedure: Secondary | ICD-10-CM | POA: Diagnosis not present

## 2015-07-04 DIAGNOSIS — T83038A Leakage of other indwelling urethral catheter, initial encounter: Secondary | ICD-10-CM | POA: Diagnosis present

## 2015-07-04 LAB — URINALYSIS, ROUTINE W REFLEX MICROSCOPIC
GLUCOSE, UA: NEGATIVE mg/dL
Ketones, ur: 15 mg/dL — AB
Nitrite: NEGATIVE
PROTEIN: 100 mg/dL — AB
Urobilinogen, UA: 0.2 mg/dL (ref 0.0–1.0)
pH: 5.5 (ref 5.0–8.0)

## 2015-07-04 LAB — URINE CULTURE: CULTURE: NO GROWTH

## 2015-07-04 LAB — URINE MICROSCOPIC-ADD ON

## 2015-07-04 MED ORDER — CEPHALEXIN 500 MG PO CAPS
500.0000 mg | ORAL_CAPSULE | Freq: Once | ORAL | Status: AC
  Administered 2015-07-04: 500 mg via ORAL
  Filled 2015-07-04: qty 1

## 2015-07-04 MED ORDER — CEPHALEXIN 500 MG PO CAPS: 500.0000 mg | ORAL_CAPSULE | Freq: Four times a day (QID) | ORAL | Status: AC

## 2015-07-04 NOTE — ED Notes (Signed)
Patient states he had urinary catheter placed two days ago and was told "to return if anything happened to the catheter." States he was getting ready for appointment with urologist and noticed urine is leaking out around the catheter.

## 2015-07-04 NOTE — ED Provider Notes (Signed)
CSN: [id number]     Arrival date & time 07/04/15  1302 History   First MD Initiated Contact with Patient 07/04/15 1320     Chief Complaint  Patient presents with  . Foley Catheter Leaking      (Consider location/radiation/quality/duration/timing/severity/associated sxs/prior Treatment) HPI  Found to have BPH on her recent visit and a Foley was told to follow-up with urology. However today mid decreased output into the bag and urinating around the catheter. Also had increase in vague suprapubic abdominal pain similar to when he was here couple days ago. No fevers, back pain and some new, rectal pain or other symptoms this time. This has been owing on for a few hours. No radiation.  Past Medical History  Diagnosis Date  . Varicose veins of legs     right   . Blurred vision   . Diabetes mellitus   . Severe headache   . Syncopal episodes     with vision change   . Leg pain   . Hyperlipidemia   . Allergic rhinitis   . Arthritis   . Diverticulitis   . BPH (benign prostatic hyperplasia)   . Hypertension   . Hyposomnia, insomnia, or sleeplessness associated with anxiety   . Dermatitis   . Lumbar stress fracture    Past Surgical History  Procedure Laterality Date  . None     Family History  Problem Relation Age of Onset  . CAD Father    Social History  Substance Use Topics  . Smoking status: Former Smoker    Quit date: 11/01/1972  . Smokeless tobacco: None  . Alcohol Use: No    Review of Systems  Constitutional: Negative for fever, chills and fatigue.  Eyes: Negative for pain and visual disturbance.  Gastrointestinal: Positive for abdominal pain.  All other systems reviewed and are negative.     Allergies  Sulfa antibiotics  Home Medications   Prior to Admission medications   Medication Sig Start Date End Date Taking? Authorizing Provider  amLODipine (NORVASC) 10 MG tablet Take 1 tablet (10 mg total) by mouth daily. 02/14/13  Yes Rollene Rotunda, MD  aspirin 81  MG chewable tablet Chew 81 mg by mouth daily.    Yes Historical Provider, MD  atorvastatin (LIPITOR) 40 MG tablet Take 40 mg by mouth daily.   Yes Historical Provider, MD  clonazePAM (KLONOPIN) 1 MG tablet Take 1 mg by mouth at bedtime as needed for anxiety.    Yes Historical Provider, MD  clotrimazole-betamethasone (LOTRISONE) cream Apply 1 application topically 2 (two) times daily. Applied to groin area at bedtime 03/08/13  Yes Mary-Margaret Daphine Deutscher, FNP  cyclobenzaprine (FLEXERIL) 5 MG tablet Take 1 tablet (5 mg total) by mouth 3 (three) times daily as needed for muscle spasms. 04/26/13  Yes Mary-Margaret Daphine Deutscher, FNP  desoximetasone (TOPICORT) 0.25 % cream Apply 1 application topically 2 (two) times daily.   Yes Historical Provider, MD  fentaNYL (DURAGESIC - DOSED MCG/HR) 50 MCG/HR Place 50 mcg onto the skin every 3 (three) days.   Yes Historical Provider, MD  finasteride (PROSCAR) 5 MG tablet Take 5 mg by mouth daily.   Yes Historical Provider, MD  glimepiride (AMARYL) 4 MG tablet Take 4 mg by mouth 2 (two) times daily. 03/08/13  Yes Mary-Margaret Daphine Deutscher, FNP  lisinopril (PRINIVIL,ZESTRIL) 20 MG tablet Take 1 tablet (20 mg total) by mouth daily. 05/21/13  Yes Mae Shelda Altes, FNP  LORazepam (ATIVAN) 0.5 MG tablet Take 0.5 mg by mouth 2 (two) times daily.  Yes Historical Provider, MD  lubiprostone (AMITIZA) 24 MCG capsule Take 24 mcg by mouth 2 (two) times daily with a meal.    Yes Historical Provider, MD  metoprolol tartrate (LOPRESSOR) 25 MG tablet TAKE 1 TABLET (25 MG TOTAL) BY MOUTH 2 (TWO) TIMES DAILY. 05/24/13  Yes Ileana Ladd, MD  montelukast (SINGULAIR) 10 MG tablet TAKE 1 TABLET BY MOUTH AT BEDTIME 06/11/13  Yes Mary-Margaret Daphine Deutscher, FNP  oxyCODONE-acetaminophen (PERCOCET) 7.5-325 MG per tablet Take 1 tablet by mouth every 8 (eight) hours as needed for pain.   Yes Historical Provider, MD  polyethylene glycol powder (GLYCOLAX/MIRALAX) powder Take 17 g by mouth 2 (two) times daily. 05/09/13  Yes  Carleene Cooper, MD  terazosin (HYTRIN) 10 MG capsule TAKE 1 CAPSULE TWICE A DAY 07/04/13  Yes Mary-Margaret Daphine Deutscher, FNP  cephALEXin (KEFLEX) 500 MG capsule Take 1 capsule (500 mg total) by mouth 4 (four) times daily. 07/04/15 07/11/15  Marily Memos, MD   BP 112/72 mmHg  Pulse 96  Temp(Src) 98.6 F (37 C) (Oral)  Resp 18  Ht  (1.778 m)  Wt 198 lb (89.812 kg)  BMI 28.41 kg/m2  SpO2 95% Physical Exam  Constitutional: He is oriented to person, place, and time. He appears well-developed and well-nourished.  HENT:  Head: Normocephalic and atraumatic.  Eyes: Conjunctivae and EOM are normal.  Neck: Normal range of motion. Neck supple.  Cardiovascular: Normal rate and regular rhythm.   Pulmonary/Chest: Effort normal. No respiratory distress.  Abdominal: Soft. There is tenderness (slight suprapubic).  Musculoskeletal: Normal range of motion. He exhibits no edema or tenderness.  Neurological: He is alert and oriented to person, place, and time.  Skin: Skin is warm and dry.  Nursing note and vitals reviewed.   ED Course  Procedures (including critical care time) Labs Review Labs Reviewed  URINALYSIS, ROUTINE W REFLEX MICROSCOPIC (NOT AT [facility]) - Abnormal; Notable for the following:    APPearance HAZY (*)    Specific Gravity, Urine >1.030 (*)    Hgb urine dipstick LARGE (*)    Bilirubin Urine MODERATE (*)    Ketones, ur 15 (*)    Protein, ur 100 (*)    Leukocytes, UA TRACE (*)    All other components within normal limits  URINE MICROSCOPIC-ADD ON - Abnormal; Notable for the following:    Bacteria, UA FEW (*)    All other components within normal limits    Imaging Review Ct Abdomen Pelvis W Contrast  07/02/2015   CLINICAL DATA:  Periumbilical pain and fatigue  EXAM: CT ABDOMEN AND PELVIS WITH CONTRAST  TECHNIQUE: Multidetector CT imaging of the abdomen and pelvis was performed using the standard protocol following bolus administration of intravenous contrast.  CONTRAST:  25mL  OMNIPAQUE IOHEXOL 300 MG/ML SOLN, OMNIPAQUE IOHEXOL 300 MG/ML SOLN  COMPARISON:  05/20/2013  FINDINGS: BODY WALL: Fatty umbilical hernia. Small fatty bilateral inguinal hernias.  LOWER CHEST: No contributory findings.  ABDOMEN/PELVIS:  Liver: No focal abnormality.  Biliary: No evidence of biliary obstruction or stone.  Pancreas: Unremarkable.  Spleen: Unremarkable.  Adrenals: Unremarkable.  Kidneys and ureters: 3 left renal calculi measuring up to 5 mm. No hydronephrosis or ureteral stone. Mild left renal cortical scarring near a lower pole stone.  Bladder: Moderately distended with mild wall thickening.  Reproductive: Stable enlargement of prostate, projecting into the bladder.  Bowel: Extensive distal colonic diverticulosis without inflammation. No bowel obstruction. No appendicitis.  Retroperitoneum: No mass or adenopathy.  Peritoneum: No ascites or pneumoperitoneum.  Vascular: No acute abnormality.  OSSEOUS: Remote endplate fractures of T11, L1, L2, L3, and L4. The T12 and L2 fractures have developed since 2014.  IMPRESSION: 1. No acute finding. 2. Distended urinary bladder, likely chronic outlet obstruction in this patient with prostatomegaly. 3. Nonobstructive left nephrolithiasis. 4. Extensive colonic diverticulosis.   Electronically Signed   By: Marnee Spring M.D.   On: 07/02/2015 23:09   I have personally reviewed and evaluated these images and lab results as part of my medical decision-making.   EKG Interpretation None      MDM   Final diagnoses:  Obstructed Foley catheter, initial encounter  UTI (lower urinary tract infection)   Likely clogged Foley catheter. We'll replace and if symptoms resolve we'll continue outpatient plan as described with follow-up with urology in a couple days.  Replaced Foley with proximal inter cc of output. Sample sent for analysis and found to have likely urinary tract infection so we'll start treatment for that. Urine has a follow-up appointment with  his primary doctor Monday so they can advise further antibiotic usage and urology can decide on total duration.  I have personally and contemperaneously reviewed labs and imaging and used in my decision making as above.   A medical screening exam was performed and I feel the patient has had an appropriate workup for their chief complaint at this time and likelihood of emergent condition existing is low. They have been counseled on decision, discharge, follow up and which symptoms necessitate immediate return to the emergency department. They or their family verbally stated understanding and agreement with plan and discharged in stable condition.      Marily Memos, MD 07/04/15 [phone]

## 2015-07-07 DIAGNOSIS — R1310 Dysphagia, unspecified: Secondary | ICD-10-CM | POA: Diagnosis not present

## 2015-07-07 DIAGNOSIS — E1169 Type 2 diabetes mellitus with other specified complication: Secondary | ICD-10-CM | POA: Diagnosis not present

## 2015-07-07 DIAGNOSIS — E1140 Type 2 diabetes mellitus with diabetic neuropathy, unspecified: Secondary | ICD-10-CM | POA: Diagnosis not present

## 2015-07-07 DIAGNOSIS — J309 Allergic rhinitis, unspecified: Secondary | ICD-10-CM | POA: Diagnosis not present

## 2015-07-07 DIAGNOSIS — N401 Enlarged prostate with lower urinary tract symptoms: Secondary | ICD-10-CM | POA: Insufficient documentation

## 2015-07-07 DIAGNOSIS — R339 Retention of urine, unspecified: Secondary | ICD-10-CM | POA: Diagnosis not present

## 2015-07-07 DIAGNOSIS — R809 Proteinuria, unspecified: Secondary | ICD-10-CM | POA: Diagnosis not present

## 2015-07-07 DIAGNOSIS — E1129 Type 2 diabetes mellitus with other diabetic kidney complication: Secondary | ICD-10-CM | POA: Diagnosis not present

## 2015-07-07 DIAGNOSIS — G4700 Insomnia, unspecified: Secondary | ICD-10-CM | POA: Diagnosis not present

## 2015-07-07 DIAGNOSIS — E785 Hyperlipidemia, unspecified: Secondary | ICD-10-CM | POA: Diagnosis not present

## 2015-07-07 DIAGNOSIS — I152 Hypertension secondary to endocrine disorders: Secondary | ICD-10-CM | POA: Diagnosis not present

## 2015-07-07 DIAGNOSIS — M549 Dorsalgia, unspecified: Secondary | ICD-10-CM | POA: Diagnosis not present

## 2015-07-07 DIAGNOSIS — E349 Endocrine disorder, unspecified: Secondary | ICD-10-CM | POA: Diagnosis not present

## 2015-07-07 DIAGNOSIS — N419 Inflammatory disease of prostate, unspecified: Secondary | ICD-10-CM | POA: Diagnosis not present

## 2015-07-08 DIAGNOSIS — R339 Retention of urine, unspecified: Secondary | ICD-10-CM | POA: Diagnosis not present

## 2015-07-17 DIAGNOSIS — N400 Enlarged prostate without lower urinary tract symptoms: Secondary | ICD-10-CM | POA: Diagnosis not present

## 2015-07-17 DIAGNOSIS — R339 Retention of urine, unspecified: Secondary | ICD-10-CM | POA: Diagnosis not present

## 2015-07-17 DIAGNOSIS — N200 Calculus of kidney: Secondary | ICD-10-CM | POA: Insufficient documentation

## 2015-07-31 ENCOUNTER — Ambulatory Visit: Payer: Medicare Other | Admitting: Podiatry

## 2015-08-01 ENCOUNTER — Encounter: Payer: Self-pay | Admitting: Podiatry

## 2015-08-01 ENCOUNTER — Ambulatory Visit (INDEPENDENT_AMBULATORY_CARE_PROVIDER_SITE_OTHER): Payer: Medicare Other | Admitting: Podiatry

## 2015-08-01 DIAGNOSIS — B351 Tinea unguium: Secondary | ICD-10-CM | POA: Diagnosis not present

## 2015-08-01 DIAGNOSIS — M79676 Pain in unspecified toe(s): Secondary | ICD-10-CM

## 2015-08-02 NOTE — Progress Notes (Signed)
Patient ID: Omar Porter, male   DOB: 01-08-1941, 74 y.o.   MRN: [id number] Complaint:  Visit Type: Patient returns to my office for continued preventative foot care services. Complaint: Patient states" my nails have grown long and thick and become painful to walk and wear shoes" .This patient presents with diabetes with no foot complications. The patient presents for preventative foot care services. No changes to ROS  Podiatric Exam: Vascular: dorsalis pedis and posterior tibial pulses are palpable bilateral. Capillary return is immediate. Temperature gradient is WNL. Skin turgor WNL  Sensorium: Normal Semmes Weinstein monofilament test. Normal tactile sensation bilaterally. Nail Exam: Pt has thick disfigured discolored nails with subungual debris noted bilateral entire nail hallux . Ulcer Exam: There is no evidence of ulcer or pre-ulcerative changes or infection. Orthopedic Exam: Muscle tone and strength are WNL. No limitations in general ROM. No crepitus or effusions noted. Foot type and digits show no abnormalities. Bony prominences are unremarkable. Skin: No Porokeratosis. No infection or ulcers  Diagnosis:  Onychomycosis, , Pain in right toe, pain in left toes  Treatment & Plan Procedures and Treatment: Consent by patient was obtained for treatment procedures. The patient understood the discussion of treatment and procedures well. All questions were answered thoroughly reviewed. Debridement of mycotic and hypertrophic toenails, 1 through 5 bilateral and clearing of subungual debris. No ulceration, no infection noted.  Return Visit-Office Procedure: Patient instructed to return to the office for a follow up visit 3 months for continued evaluation and treatment.

## 2015-10-14 DIAGNOSIS — M545 Low back pain: Secondary | ICD-10-CM | POA: Diagnosis not present

## 2015-10-28 DIAGNOSIS — E349 Endocrine disorder, unspecified: Secondary | ICD-10-CM | POA: Diagnosis not present

## 2015-10-28 DIAGNOSIS — Z23 Encounter for immunization: Secondary | ICD-10-CM | POA: Diagnosis not present

## 2015-10-28 DIAGNOSIS — E1129 Type 2 diabetes mellitus with other diabetic kidney complication: Secondary | ICD-10-CM | POA: Diagnosis not present

## 2015-10-28 DIAGNOSIS — Z794 Long term (current) use of insulin: Secondary | ICD-10-CM | POA: Diagnosis not present

## 2015-10-28 DIAGNOSIS — E1169 Type 2 diabetes mellitus with other specified complication: Secondary | ICD-10-CM | POA: Diagnosis not present

## 2015-10-28 DIAGNOSIS — E785 Hyperlipidemia, unspecified: Secondary | ICD-10-CM | POA: Diagnosis not present

## 2015-10-28 DIAGNOSIS — G4700 Insomnia, unspecified: Secondary | ICD-10-CM | POA: Diagnosis not present

## 2015-10-28 DIAGNOSIS — I152 Hypertension secondary to endocrine disorders: Secondary | ICD-10-CM | POA: Diagnosis not present

## 2015-10-28 DIAGNOSIS — E1140 Type 2 diabetes mellitus with diabetic neuropathy, unspecified: Secondary | ICD-10-CM | POA: Diagnosis not present

## 2015-10-28 DIAGNOSIS — R809 Proteinuria, unspecified: Secondary | ICD-10-CM | POA: Diagnosis not present

## 2015-10-30 ENCOUNTER — Ambulatory Visit: Payer: Medicare Other | Admitting: Podiatry

## 2015-12-03 ENCOUNTER — Encounter: Payer: Self-pay | Admitting: Podiatry

## 2015-12-03 ENCOUNTER — Ambulatory Visit (INDEPENDENT_AMBULATORY_CARE_PROVIDER_SITE_OTHER): Payer: Medicare Other | Admitting: Podiatry

## 2015-12-03 DIAGNOSIS — B351 Tinea unguium: Secondary | ICD-10-CM

## 2015-12-03 DIAGNOSIS — M79676 Pain in unspecified toe(s): Secondary | ICD-10-CM

## 2015-12-03 NOTE — Progress Notes (Signed)
Patient ID: Omar Porter, male   DOB: 01/03/1941, 75 y.o.   MRN: [id number] Complaint:  Visit Type: Patient returns to my office for continued preventative foot care services. Complaint: Patient states" my nails have grown long and thick and become painful to walk and wear shoes" .This patient presents with diabetes with no foot complications. The patient presents for preventative foot care services. No changes to ROS  Podiatric Exam: Vascular: dorsalis pedis and posterior tibial pulses are palpable bilateral. Capillary return is immediate. Temperature gradient is WNL. Skin turgor WNL  Sensorium: Normal Semmes Weinstein monofilament test. Normal tactile sensation bilaterally. Nail Exam: Pt has thick disfigured discolored nails with subungual debris noted bilateral entire nail hallux . Ulcer Exam: There is no evidence of ulcer or pre-ulcerative changes or infection. Orthopedic Exam: Muscle tone and strength are WNL. No limitations in general ROM. No crepitus or effusions noted. Foot type and digits show no abnormalities. Bony prominences are unremarkable. Skin: No Porokeratosis. No infection or ulcers  Diagnosis:  Onychomycosis, , Pain in right toe, pain in left toes  Treatment & Plan Procedures and Treatment: Consent by patient was obtained for treatment procedures. The patient understood the discussion of treatment and procedures well. All questions were answered thoroughly reviewed. Debridement of mycotic and hypertrophic toenails, 1 through 5 bilateral and clearing of subungual debris. No ulceration, no infection noted.  Return Visit-Office Procedure: Patient instructed to return to the office for a follow up visit 3 months for continued evaluation and treatment.   Helane Gunther DPM

## 2016-02-25 ENCOUNTER — Ambulatory Visit (INDEPENDENT_AMBULATORY_CARE_PROVIDER_SITE_OTHER): Payer: Medicare Other | Admitting: Podiatry

## 2016-02-25 ENCOUNTER — Encounter: Payer: Self-pay | Admitting: Podiatry

## 2016-02-25 DIAGNOSIS — B351 Tinea unguium: Secondary | ICD-10-CM

## 2016-02-25 DIAGNOSIS — M79676 Pain in unspecified toe(s): Secondary | ICD-10-CM | POA: Diagnosis not present

## 2016-02-25 NOTE — Progress Notes (Signed)
Patient ID: Omar Porter, male   DOB: 08/12/1941, 75 y.o.   MRN: [id number] Complaint:  Visit Type: Patient returns to my office for continued preventative foot care services. Complaint: Patient states" my nails have grown long and thick and become painful to walk and wear shoes" .This patient presents with diabetes with no foot complications. The patient presents for preventative foot care services. No changes to ROS  Podiatric Exam: Vascular: dorsalis pedis and posterior tibial pulses are palpable bilateral. Capillary return is immediate. Temperature gradient is WNL. Skin turgor WNL  Sensorium: Normal Semmes Weinstein monofilament test. Normal tactile sensation bilaterally. Nail Exam: Pt has thick disfigured discolored nails with subungual debris noted bilateral entire nail hallux . Ulcer Exam: There is no evidence of ulcer or pre-ulcerative changes or infection. Orthopedic Exam: Muscle tone and strength are WNL. No limitations in general ROM. No crepitus or effusions noted. Foot type and digits show no abnormalities. Bony prominences are unremarkable. Skin: No Porokeratosis. No infection or ulcers  Diagnosis:  Onychomycosis, , Pain in right toe, pain in left toes  Treatment & Plan Procedures and Treatment: Consent by patient was obtained for treatment procedures. The patient understood the discussion of treatment and procedures well. All questions were answered thoroughly reviewed. Debridement of mycotic and hypertrophic toenails, 1 through 5 bilateral and clearing of subungual debris. No ulceration, no infection noted.  Return Visit-Office Procedure: Patient instructed to return to the office for a follow up visit 3 months for continued evaluation and treatment.   Helane GuntherGregory Tryniti Laatsch DPM

## 2016-03-26 DIAGNOSIS — E1169 Type 2 diabetes mellitus with other specified complication: Secondary | ICD-10-CM | POA: Diagnosis not present

## 2016-03-26 DIAGNOSIS — N419 Inflammatory disease of prostate, unspecified: Secondary | ICD-10-CM | POA: Diagnosis not present

## 2016-03-26 DIAGNOSIS — E785 Hyperlipidemia, unspecified: Secondary | ICD-10-CM | POA: Diagnosis not present

## 2016-03-26 DIAGNOSIS — I152 Hypertension secondary to endocrine disorders: Secondary | ICD-10-CM | POA: Diagnosis not present

## 2016-03-26 DIAGNOSIS — Z794 Long term (current) use of insulin: Secondary | ICD-10-CM | POA: Diagnosis not present

## 2016-03-26 DIAGNOSIS — E1129 Type 2 diabetes mellitus with other diabetic kidney complication: Secondary | ICD-10-CM | POA: Diagnosis not present

## 2016-03-26 DIAGNOSIS — R809 Proteinuria, unspecified: Secondary | ICD-10-CM | POA: Diagnosis not present

## 2016-03-26 DIAGNOSIS — E349 Endocrine disorder, unspecified: Secondary | ICD-10-CM | POA: Diagnosis not present

## 2016-03-26 DIAGNOSIS — G4700 Insomnia, unspecified: Secondary | ICD-10-CM | POA: Diagnosis not present

## 2016-03-26 DIAGNOSIS — F419 Anxiety disorder, unspecified: Secondary | ICD-10-CM | POA: Diagnosis not present

## 2016-03-26 DIAGNOSIS — J309 Allergic rhinitis, unspecified: Secondary | ICD-10-CM | POA: Diagnosis not present

## 2016-03-26 DIAGNOSIS — M549 Dorsalgia, unspecified: Secondary | ICD-10-CM | POA: Diagnosis not present

## 2016-06-10 ENCOUNTER — Encounter: Payer: Self-pay | Admitting: Podiatry

## 2016-06-10 ENCOUNTER — Ambulatory Visit (INDEPENDENT_AMBULATORY_CARE_PROVIDER_SITE_OTHER): Payer: Medicare Other | Admitting: Podiatry

## 2016-06-10 DIAGNOSIS — B351 Tinea unguium: Secondary | ICD-10-CM | POA: Diagnosis not present

## 2016-06-10 DIAGNOSIS — M79676 Pain in unspecified toe(s): Secondary | ICD-10-CM

## 2016-06-10 NOTE — Progress Notes (Signed)
Patient ID: Omar Porter, male   DOB: 07/11/1941, 75 y.o.   MRN: [id number] Complaint:  Visit Type: Patient returns to my office for continued preventative foot care services. Complaint: Patient states" my nails have grown long and thick and become painful to walk and wear shoes" .This patient presents with diabetes with no foot complications. The patient presents for preventative foot care services. No changes to ROS  Podiatric Exam: Vascular: dorsalis pedis and posterior tibial pulses are palpable bilateral. Capillary return is immediate. Temperature gradient is WNL. Skin turgor WNL  Sensorium: Normal Semmes Weinstein monofilament test. Normal tactile sensation bilaterally. Nail Exam: Pt has thick disfigured discolored nails with subungual debris noted bilateral entire nail hallux . Ulcer Exam: There is no evidence of ulcer or pre-ulcerative changes or infection. Orthopedic Exam: Muscle tone and strength are WNL. No limitations in general ROM. No crepitus or effusions noted. Foot type and digits show no abnormalities. Bony prominences are unremarkable. Skin: No Porokeratosis. No infection or ulcers  Diagnosis:  Onychomycosis, , Pain in right toe, pain in left toes  Treatment & Plan Procedures and Treatment: Consent by patient was obtained for treatment procedures. The patient understood the discussion of treatment and procedures well. All questions were answered thoroughly reviewed. Debridement of mycotic and hypertrophic toenails, 1 through 5 bilateral and clearing of subungual debris. No ulceration, no infection noted.  Return Visit-Office Procedure: Patient instructed to return to the office for a follow up visit 3 months for continued evaluation and treatment.   Betty Brooks DPM 

## 2016-07-24 DIAGNOSIS — R6889 Other general symptoms and signs: Secondary | ICD-10-CM | POA: Insufficient documentation

## 2016-09-02 NOTE — Patient Instructions (Signed)
Your procedure is scheduled on:   09/07/2016              [facility] at   8:00  AM.  Call this number if you have problems the morning of surgery: [phone]   Remember:   Do not eat or drink :After Midnight.    Take these medicines the morning of surgery with A SIP OF WATER:         Amlodipine, Klonopin, lisinopril, Metoprolol, and singulair   Do not wear jewelry, make-up or nail polish.  Do not wear lotions, powders, or perfumes. You may wear deodorant.  Do not bring valuables to the hospital.  Contacts, dentures or bridgework may not be worn into surgery.  Patients discharged the day of surgery will not be allowed to drive home.  Name and phone number of your driver:    @[email or web address] @ Cataract Surgery  A cataract is a clouding of the lens of the eye. When a lens becomes cloudy, vision is reduced based on the degree and nature of the clouding. Surgery may be needed to improve vision. Surgery removes the cloudy lens and usually replaces it with a substitute lens (intraocular lens, IOL). LET YOUR EYE DOCTOR KNOW ABOUT:  Allergies to food or medicine.   Medicines taken including herbs, eyedrops, over-the-counter medicines, and creams.   Use of steroids (by mouth or creams).   Previous problems with anesthetics or numbing medicine.   History of bleeding problems or blood clots.   Previous surgery.   Other health problems, including diabetes and kidney problems.   Possibility of pregnancy, if this applies.  RISKS AND COMPLICATIONS  Infection.   Inflammation of the eyeball (endophthalmitis) that can spread to both eyes (sympathetic ophthalmia).   Poor wound healing.   If an IOL is inserted, it can later fall out of proper position. This is very uncommon.   Clouding of the part of your eye that holds an IOL in place. This is called an "after-cataract." These are uncommon, but easily treated.  BEFORE THE PROCEDURE  Do not eat or drink anything except  small amounts of water for 8 to 12 before your surgery, or as directed by your caregiver.   Unless you are told otherwise, continue any eyedrops you have been prescribed.   Talk to your primary caregiver about all other medicines that you take (both prescription and non-prescription). In some cases, you may need to stop or change medicines near the time of your surgery. This is most important if you are taking blood-thinning medicine.Do not stop medicines unless you are told to do so.   Arrange for someone to drive you to and from the procedure.   Do not put contact lenses in either eye on the day of your surgery.  PROCEDURE There is more than one method for safely removing a cataract. Your doctor can explain the differences and help determine which is best for you. Phacoemulsification surgery is the most common form of cataract surgery.  An injection is given behind the eye or eyedrops are given to make this a painless procedure.   A small cut (incision) is made on the edge of the clear, dome-shaped surface that covers the front of the eye (cornea).   A tiny probe is painlessly inserted into the eye. This device gives off ultrasound waves that soften and break up the cloudy center of the lens. This makes it easier for the cloudy lens to be removed by suction.  An IOL may be implanted.   The normal lens of the eye is covered by a clear capsule. Part of that capsule is intentionally left in the eye to support the IOL.   Your surgeon may or may not use stitches to close the incision.  There are other forms of cataract surgery that require a larger incision and stiches to close the eye. This approach is taken in cases where the doctor feels that the cataract cannot be easily removed using phacoemulsification. AFTER THE PROCEDURE  When an IOL is implanted, it does not need care. It becomes a permanent part of your eye and cannot be seen or felt.   Your doctor will schedule follow-up exams  to check on your progress.   Review your other medicines with your doctor to see which can be resumed after surgery.   Use eyedrops or take medicine as prescribed by your doctor.  Document Released: 10/21/2011 Document Reviewed: 10/18/2011 [facility] Patient Information 2012 [place], [place].  .Cataract Surgery Care After Refer to this sheet in the next few weeks. These instructions provide you with information on caring for yourself after your procedure. Your caregiver may also give you more specific instructions. Your treatment has been planned according to current medical practices, but problems sometimes occur. Call your caregiver if you have any problems or questions after your procedure.  HOME CARE INSTRUCTIONS   Avoid strenuous activities as directed by your caregiver.   Ask your caregiver when you can resume driving.   Use eyedrops or other medicines to help healing and control pressure inside your eye as directed by your caregiver.   Only take over-the-counter or prescription medicines for pain, discomfort, or fever as directed by your caregiver.   Do not to touch or rub your eyes.   You may be instructed to use a protective shield during the first few days and nights after surgery. If not, wear sunglasses to protect your eyes. This is to protect the eye from pressure or from being accidentally bumped.   Keep the area around your eye clean and dry. Avoid swimming or allowing water to hit you directly in the face while showering. Keep soap and shampoo out of your eyes.   Do not bend or lift heavy objects. Bending increases pressure in the eye. You can walk, climb stairs, and do light household chores.   Do not put a contact lens into the eye that had surgery until your caregiver says it is okay to do so.   Ask your doctor when you can return to work. This will depend on the kind of work that you do. If you work in a dusty environment, you may be advised to wear protective eyewear  for a period of time.   Ask your caregiver when it will be safe to engage in sexual activity.   Continue with your regular eye exams as directed by your caregiver.  What to expect:  It is normal to feel itching and mild discomfort for a few days after cataract surgery. Some fluid discharge is also common, and your eye may be sensitive to light and touch.   After 1 to 2 days, even moderate discomfort should disappear. In most cases, healing will take about 6 weeks.   If you received an intraocular lens (IOL), you may notice that colors are very bright or have a blue tinge. Also, if you have been in bright sunlight, everything may appear reddish for a few hours. If you see these color  tinges, it is because your lens is clear and no longer cloudy. Within a few months after receiving an IOL, these extra colors should go away. When you have healed, you will probably need new glasses.  SEEK MEDICAL CARE IF:   You have increased bruising around your eye.   You have discomfort not helped by medicine.  SEEK IMMEDIATE MEDICAL CARE IF:   You have a fever.   You have a worsening or sudden vision loss.   You have redness, swelling, or increasing pain in the eye.   You have a thick discharge from the eye that had surgery.  MAKE SURE YOU:  Understand these instructions.   Will watch your condition.   Will get help right away if you are not doing well or get worse.  Document Released: 05/21/2005 Document Revised: 10/21/2011 Document Reviewed: 06/25/2011 [facility] Patient Information 2012 [place].    Monitored Anesthesia Care  Monitored anesthesia care is an anesthesia service for a medical procedure. Anesthesia is the loss of the ability to feel pain. It is produced by medications called anesthetics. It may affect a small area of your body (local anesthesia), a large area of your body (regional anesthesia), or your entire body (general anesthesia). The need for monitored anesthesia care  depends your procedure, your condition, and the potential need for regional or general anesthesia. It is often provided during procedures where:   General anesthesia may be needed if there are complications. This is because you need special care when you are under general anesthesia.   You will be under local or regional anesthesia. This is so that you are able to have higher levels of anesthesia if needed.   You will receive calming medications (sedatives). This is especially the case if sedatives are given to put you in a semi-conscious state of relaxation (deep sedation). This is because the amount of sedative needed to produce this state can be hard to predict. Too much of a sedative can produce general anesthesia. Monitored anesthesia care is performed by one or more caregivers who have special training in all types of anesthesia. You will need to meet with these caregivers before your procedure. During this meeting, they will ask you about your medical history. They will also give you instructions to follow. (For example, you will need to stop eating and drinking before your procedure. You may also need to stop or change medications you are taking.) During your procedure, your caregivers will stay with you. They will:   Watch your condition. This includes watching you blood pressure, breathing, and level of pain.   Diagnose and treat problems that occur.   Give medications if they are needed. These may include calming medications (sedatives) and anesthetics.   Make sure you are comfortable.  Having monitored anesthesia care does not necessarily mean that you will be under anesthesia. It does mean that your caregivers will be able to manage anesthesia if you need it or if it occurs. It also means that you will be able to have a different type of anesthesia than you are having if you need it. When your procedure is complete, your caregivers will continue to watch your condition. They will  make sure any medications wear off before you are allowed to go home.  Document Released: 07/28/2005 Document Revised: 02/26/2013 Document Reviewed: 12/13/2012 [facility] Patient Information 2014 [place], [place].

## 2016-09-03 ENCOUNTER — Encounter (HOSPITAL_COMMUNITY): Payer: Self-pay

## 2016-09-03 ENCOUNTER — Encounter (HOSPITAL_COMMUNITY)
Admission: RE | Admit: 2016-09-03 | Discharge: 2016-09-03 | Disposition: A | Discharge status: Home / Self Care | Payer: Medicare Other | Source: Ambulatory Visit | Attending: Ophthalmology | Admitting: Ophthalmology

## 2016-09-03 DIAGNOSIS — Z01812 Encounter for preprocedural laboratory examination: Secondary | ICD-10-CM | POA: Diagnosis not present

## 2016-09-03 DIAGNOSIS — Z01818 Encounter for other preprocedural examination: Secondary | ICD-10-CM | POA: Diagnosis not present

## 2016-09-03 DIAGNOSIS — R001 Bradycardia, unspecified: Secondary | ICD-10-CM | POA: Diagnosis not present

## 2016-09-03 DIAGNOSIS — H269 Unspecified cataract: Secondary | ICD-10-CM | POA: Insufficient documentation

## 2016-09-03 HISTORY — DX: Other chronic pain: G89.29

## 2016-09-03 HISTORY — DX: Anxiety disorder, unspecified: F41.9

## 2016-09-03 HISTORY — DX: Dorsalgia, unspecified: M54.9

## 2016-09-03 LAB — BASIC METABOLIC PANEL
ANION GAP: 7 (ref 5–15)
BUN: 15 mg/dL (ref 6–20)
CO2: 25 mmol/L (ref 22–32)
Calcium: 8.9 mg/dL (ref 8.9–10.3)
Chloride: 105 mmol/L (ref 101–111)
Creatinine, Ser: 0.95 mg/dL (ref 0.61–1.24)
GFR calc Af Amer: >60 mL/min (ref >60)
GFR calc non Af Amer: >60 mL/min (ref >60)
GLUCOSE: 116 mg/dL — AB (ref 65–99)
Potassium: 3.5 mmol/L (ref 3.5–5.1)
Sodium: 137 mmol/L (ref 135–145)

## 2016-09-03 LAB — CBC WITH DIFFERENTIAL/PLATELET
BASOS ABS: 0 10*3/uL (ref 0.0–0.1)
Basophils Relative: 1 %
EOS PCT: 5 %
Eosinophils Absolute: 0.4 10*3/uL (ref 0.0–0.7)
HEMATOCRIT: 43.7 % (ref 39.0–52.0)
Hemoglobin: 15.4 g/dL (ref 13.0–17.0)
LYMPHS ABS: 3.2 10*3/uL (ref 0.7–4.0)
LYMPHS PCT: 40 %
MCH: 31 pg (ref 26.0–34.0)
MCHC: 35.2 g/dL (ref 30.0–36.0)
MCV: 87.9 fL (ref 78.0–100.0)
Monocytes Absolute: 0.7 10*3/uL (ref 0.1–1.0)
Monocytes Relative: 9 %
NEUTROS ABS: 3.7 10*3/uL (ref 1.7–7.7)
Neutrophils Relative %: 45 %
Platelets: 203 10*3/uL (ref 150–400)
RBC: 4.97 MIL/uL (ref 4.22–5.81)
RDW: 13.6 % (ref 11.5–15.5)
WBC: 8 10*3/uL (ref 4.0–10.5)

## 2016-09-07 ENCOUNTER — Encounter (HOSPITAL_COMMUNITY): Admission: RE | Payer: Self-pay | Source: Ambulatory Visit

## 2016-09-07 ENCOUNTER — Ambulatory Visit (HOSPITAL_COMMUNITY): Admission: RE | Admit: 2016-09-07 | Payer: Medicare Other | Source: Ambulatory Visit | Admitting: Ophthalmology

## 2016-09-07 SURGERY — PHACOEMULSIFICATION, CATARACT, WITH IOL INSERTION
Anesthesia: Monitor Anesthesia Care | Laterality: Right

## 2016-09-23 ENCOUNTER — Ambulatory Visit: Payer: Medicare Other | Admitting: Podiatry

## 2016-09-23 ENCOUNTER — Inpatient Hospital Stay (HOSPITAL_COMMUNITY): Admission: RE | Admit: 2016-09-23 | Payer: Medicare Other | Source: Ambulatory Visit

## 2016-09-28 ENCOUNTER — Ambulatory Visit: Admit: 2016-09-28 | Payer: Medicare Other | Admitting: Ophthalmology

## 2016-09-28 SURGERY — PHACOEMULSIFICATION, CATARACT, WITH IOL INSERTION
Anesthesia: Monitor Anesthesia Care | Laterality: Left

## 2016-10-06 ENCOUNTER — Ambulatory Visit (INDEPENDENT_AMBULATORY_CARE_PROVIDER_SITE_OTHER): Payer: Medicare Other | Admitting: Podiatry

## 2016-10-06 ENCOUNTER — Encounter: Payer: Self-pay | Admitting: Podiatry

## 2016-10-06 VITALS — Ht 69.5 in | Wt 200.0 lb

## 2016-10-06 DIAGNOSIS — M79676 Pain in unspecified toe(s): Secondary | ICD-10-CM

## 2016-10-06 DIAGNOSIS — B351 Tinea unguium: Secondary | ICD-10-CM | POA: Diagnosis not present

## 2016-10-06 NOTE — Progress Notes (Signed)
Patient ID: Omar Porter, male   DOB: 26-Sep-1941, 75 y.o.   MRN: [id number] Complaint:  Visit Type: Patient returns to my office for continued preventative foot care services. Complaint: Patient states" my nails have grown long and thick and become painful to walk and wear shoes" .This patient presents with diabetes with no foot complications. The patient presents for preventative foot care services. No changes to ROS  Podiatric Exam: Vascular: dorsalis pedis and posterior tibial pulses are palpable bilateral. Capillary return is immediate. Temperature gradient is WNL. Skin turgor WNL  Sensorium: Normal Semmes Weinstein monofilament test. Normal tactile sensation bilaterally. Nail Exam: Pt has thick disfigured discolored nails with subungual debris noted bilateral entire nail hallux . Ulcer Exam: There is no evidence of ulcer or pre-ulcerative changes or infection. Orthopedic Exam: Muscle tone and strength are WNL. No limitations in general ROM. No crepitus or effusions noted. Foot type and digits show no abnormalities. Bony prominences are unremarkable. Skin: No Porokeratosis. No infection or ulcers  Diagnosis:  Onychomycosis, , Pain in right toe, pain in left toes  Treatment & Plan Procedures and Treatment: Consent by patient was obtained for treatment procedures. The patient understood the discussion of treatment and procedures well. All questions were answered thoroughly reviewed. Debridement of mycotic and hypertrophic toenails, 1 through 5 bilateral and clearing of subungual debris. No ulceration, no infection noted.  Return Visit-Office Procedure: Patient instructed to return to the office for a follow up visit 3 months for continued evaluation and treatment.   Helane GuntherGregory Faheem Ziemann DPM

## 2017-01-05 ENCOUNTER — Ambulatory Visit (INDEPENDENT_AMBULATORY_CARE_PROVIDER_SITE_OTHER): Payer: Medicare Other | Admitting: Podiatry

## 2017-01-05 ENCOUNTER — Encounter: Payer: Self-pay | Admitting: Podiatry

## 2017-01-05 VITALS — Ht 69.5 in | Wt 200.0 lb

## 2017-01-05 DIAGNOSIS — B351 Tinea unguium: Secondary | ICD-10-CM | POA: Diagnosis not present

## 2017-01-05 DIAGNOSIS — M79676 Pain in unspecified toe(s): Secondary | ICD-10-CM

## 2017-01-05 NOTE — Progress Notes (Signed)
Patient ID: Omar Porter, male   DOB: 04/30/1941, 75 y.o.   MRN: [id number] Complaint:  Visit Type: Patient returns to my office for continued preventative foot care services. Complaint: Patient states" my nails have grown long and thick and become painful to walk and wear shoes" .This patient presents with diabetes with no foot complications. The patient presents for preventative foot care services. No changes to ROS  Podiatric Exam: Vascular: dorsalis pedis and posterior tibial pulses are palpable bilateral. Capillary return is immediate. Temperature gradient is WNL. Skin turgor WNL  Sensorium: Normal Semmes Weinstein monofilament test. Normal tactile sensation bilaterally. Nail Exam: Pt has thick disfigured discolored nails with subungual debris noted bilateral entire nail hallux . Ulcer Exam: There is no evidence of ulcer or pre-ulcerative changes or infection. Orthopedic Exam: Muscle tone and strength are WNL. No limitations in general ROM. No crepitus or effusions noted. Foot type and digits show no abnormalities. Bony prominences are unremarkable. Skin: No Porokeratosis. No infection or ulcers  Diagnosis:  Onychomycosis, , Pain in right toe, pain in left toes  Treatment & Plan Procedures and Treatment: Consent by patient was obtained for treatment procedures. The patient understood the discussion of treatment and procedures well. All questions were answered thoroughly reviewed. Debridement of mycotic and hypertrophic toenails, 1 through 5 bilateral and clearing of subungual debris. No ulceration, no infection noted.  Return Visit-Office Procedure: Patient instructed to return to the office for a follow up visit 3 months for continued evaluation and treatment.   Tevita Gomer DPM 

## 2017-04-06 ENCOUNTER — Ambulatory Visit: Payer: Medicare Other | Admitting: Podiatry

## 2017-04-28 ENCOUNTER — Ambulatory Visit (INDEPENDENT_AMBULATORY_CARE_PROVIDER_SITE_OTHER): Payer: Medicare Other | Admitting: Podiatry

## 2017-04-28 DIAGNOSIS — M79676 Pain in unspecified toe(s): Secondary | ICD-10-CM | POA: Diagnosis not present

## 2017-04-28 DIAGNOSIS — E119 Type 2 diabetes mellitus without complications: Secondary | ICD-10-CM

## 2017-04-28 DIAGNOSIS — B351 Tinea unguium: Secondary | ICD-10-CM

## 2017-04-28 NOTE — Progress Notes (Signed)
Patient ID: Omar Porter, male   DOB: 12/26/1940, 75 y.o.   MRN: [id number] Complaint:  Visit Type: Patient returns to my office for continued preventative foot care services. Complaint: Patient states" my nails have grown long and thick and become painful to walk and wear shoes" .This patient presents with diabetes with no foot complications. The patient presents for preventative foot care services. No changes to ROS  Podiatric Exam: Vascular: dorsalis pedis and posterior tibial pulses are palpable bilateral. Capillary return is immediate. Temperature gradient is WNL. Skin turgor WNL  Sensorium: Normal Semmes Weinstein monofilament test. Normal tactile sensation bilaterally. Nail Exam: Pt has thick disfigured discolored nails with subungual debris noted bilateral entire nail hallux . Ulcer Exam: There is no evidence of ulcer or pre-ulcerative changes or infection. Orthopedic Exam: Muscle tone and strength are WNL. No limitations in general ROM. No crepitus or effusions noted. Foot type and digits show no abnormalities. Bony prominences are unremarkable. Skin: No Porokeratosis. No infection or ulcers  Diagnosis:  Onychomycosis, , Pain in right toe, pain in left toes  Treatment & Plan Procedures and Treatment: Consent by patient was obtained for treatment procedures. The patient understood the discussion of treatment and procedures well. All questions were answered thoroughly reviewed. Debridement of mycotic and hypertrophic toenails, 1 through 5 bilateral and clearing of subungual debris. No ulceration, no infection noted.  Return Visit-Office Procedure: Patient instructed to return to the office for a follow up visit 3 months for continued evaluation and treatment.   Jazzelle Zhang DPM 

## 2017-09-21 ENCOUNTER — Encounter: Payer: Self-pay | Admitting: Podiatry

## 2017-09-21 ENCOUNTER — Ambulatory Visit: Payer: Medicare Other | Admitting: Podiatry

## 2017-09-21 DIAGNOSIS — M79676 Pain in unspecified toe(s): Secondary | ICD-10-CM | POA: Diagnosis not present

## 2017-09-21 DIAGNOSIS — B351 Tinea unguium: Secondary | ICD-10-CM

## 2017-09-21 DIAGNOSIS — E119 Type 2 diabetes mellitus without complications: Secondary | ICD-10-CM

## 2017-09-21 NOTE — Progress Notes (Signed)
Patient ID: Omar Porter, male   DOB: 07/03/1941, 76 y.o.   MRN: [id number] Complaint:  Visit Type: Patient returns to my office for continued preventative foot care services. Complaint: Patient states" my nails have grown long and thick and become painful to walk and wear shoes" .This patient presents with diabetes with no foot complications. The patient presents for preventative foot care services. No changes to ROS  Podiatric Exam: Vascular: dorsalis pedis and posterior tibial pulses are palpable bilateral. Capillary return is immediate. Temperature gradient is WNL. Skin turgor WNL  Sensorium: Normal Semmes Weinstein monofilament test. Normal tactile sensation bilaterally. Nail Exam: Pt has thick disfigured discolored nails with subungual debris noted bilateral entire nail hallux . Ulcer Exam: There is no evidence of ulcer or pre-ulcerative changes or infection. Orthopedic Exam: Muscle tone and strength are WNL. No limitations in general ROM. No crepitus or effusions noted. Foot type and digits show no abnormalities. Bony prominences are unremarkable. Skin: No Porokeratosis. No infection or ulcers  Diagnosis:  Onychomycosis, , Pain in right toe, pain in left toes  Treatment & Plan Procedures and Treatment: Consent by patient was obtained for treatment procedures. The patient understood the discussion of treatment and procedures well. All questions were answered thoroughly reviewed. Debridement of mycotic and hypertrophic toenails, 1 through 5 bilateral and clearing of subungual debris. No ulceration, no infection noted.  Return Visit-Office Procedure: Patient instructed to return to the office for a follow up visit 3 months for continued evaluation and treatment.   Omar Porter DPM

## 2017-12-28 ENCOUNTER — Ambulatory Visit: Payer: Medicare Other | Admitting: Podiatry

## 2017-12-28 ENCOUNTER — Encounter: Payer: Self-pay | Admitting: Podiatry

## 2017-12-28 DIAGNOSIS — M79676 Pain in unspecified toe(s): Secondary | ICD-10-CM

## 2017-12-28 DIAGNOSIS — B351 Tinea unguium: Secondary | ICD-10-CM | POA: Diagnosis not present

## 2017-12-28 DIAGNOSIS — E119 Type 2 diabetes mellitus without complications: Secondary | ICD-10-CM

## 2017-12-28 NOTE — Progress Notes (Signed)
Patient ID: Omar Porter, male   DOB: 04/06/1941, 76 y.o.   MRN: [id number] Complaint:  Visit Type: Patient returns to my office for continued preventative foot care services. Complaint: Patient states" my nails have grown long and thick and become painful to walk and wear shoes" .This patient presents with diabetes with no foot complications. The patient presents for preventative foot care services. No changes to ROS  Podiatric Exam: Vascular: dorsalis pedis and posterior tibial pulses are palpable bilateral. Capillary return is immediate. Temperature gradient is WNL. Skin turgor WNL  Sensorium: Normal Semmes Weinstein monofilament test. Normal tactile sensation bilaterally. Nail Exam: Pt has thick disfigured discolored nails with subungual debris noted bilateral entire nail hallux . Ulcer Exam: There is no evidence of ulcer or pre-ulcerative changes or infection. Orthopedic Exam: Muscle tone and strength are WNL. No limitations in general ROM. No crepitus or effusions noted. Foot type and digits show no abnormalities. Bony prominences are unremarkable. Skin: No Porokeratosis. No infection or ulcers  Diagnosis:  Onychomycosis, , Pain in right toe, pain in left toes  Treatment & Plan Procedures and Treatment: Consent by patient was obtained for treatment procedures. The patient understood the discussion of treatment and procedures well. All questions were answered thoroughly reviewed. Debridement of mycotic and hypertrophic toenails, 1 through 5 bilateral and clearing of subungual debris. No ulceration, no infection noted.  Return Visit-Office Procedure: Patient instructed to return to the office for a follow up visit 3 months for continued evaluation and treatment.   Davyon Fisch DPM 

## 2019-01-05 ENCOUNTER — Other Ambulatory Visit: Payer: Self-pay | Admitting: Family Medicine

## 2019-01-05 DIAGNOSIS — R05 Cough: Secondary | ICD-10-CM

## 2019-01-05 DIAGNOSIS — R053 Chronic cough: Secondary | ICD-10-CM

## 2019-01-15 ENCOUNTER — Ambulatory Visit (HOSPITAL_COMMUNITY): Payer: Medicare Other

## 2019-01-15 ENCOUNTER — Encounter (HOSPITAL_COMMUNITY): Payer: Self-pay

## 2020-03-17 DIAGNOSIS — S32010S Wedge compression fracture of first lumbar vertebra, sequela: Secondary | ICD-10-CM | POA: Diagnosis not present

## 2020-03-17 DIAGNOSIS — K5900 Constipation, unspecified: Secondary | ICD-10-CM | POA: Diagnosis not present

## 2020-03-17 DIAGNOSIS — F5101 Primary insomnia: Secondary | ICD-10-CM | POA: Diagnosis not present

## 2020-03-17 DIAGNOSIS — I10 Essential (primary) hypertension: Secondary | ICD-10-CM | POA: Diagnosis not present

## 2020-03-17 DIAGNOSIS — E785 Hyperlipidemia, unspecified: Secondary | ICD-10-CM | POA: Diagnosis not present

## 2020-03-17 DIAGNOSIS — E119 Type 2 diabetes mellitus without complications: Secondary | ICD-10-CM | POA: Diagnosis not present

## 2020-03-19 DIAGNOSIS — Z1211 Encounter for screening for malignant neoplasm of colon: Secondary | ICD-10-CM | POA: Diagnosis not present

## 2020-06-16 DIAGNOSIS — E119 Type 2 diabetes mellitus without complications: Secondary | ICD-10-CM | POA: Diagnosis not present

## 2020-06-16 DIAGNOSIS — E785 Hyperlipidemia, unspecified: Secondary | ICD-10-CM | POA: Diagnosis not present

## 2020-06-16 DIAGNOSIS — K5712 Diverticulitis of small intestine without perforation or abscess without bleeding: Secondary | ICD-10-CM | POA: Diagnosis not present

## 2020-06-16 DIAGNOSIS — Z0000 Encounter for general adult medical examination without abnormal findings: Secondary | ICD-10-CM | POA: Diagnosis not present

## 2020-06-16 DIAGNOSIS — F5101 Primary insomnia: Secondary | ICD-10-CM | POA: Diagnosis not present

## 2020-09-16 DIAGNOSIS — E119 Type 2 diabetes mellitus without complications: Secondary | ICD-10-CM | POA: Diagnosis not present

## 2020-09-16 DIAGNOSIS — E1143 Type 2 diabetes mellitus with diabetic autonomic (poly)neuropathy: Secondary | ICD-10-CM | POA: Diagnosis not present

## 2020-09-16 DIAGNOSIS — E785 Hyperlipidemia, unspecified: Secondary | ICD-10-CM | POA: Diagnosis not present

## 2020-09-16 DIAGNOSIS — Z0000 Encounter for general adult medical examination without abnormal findings: Secondary | ICD-10-CM | POA: Diagnosis not present

## 2020-09-16 DIAGNOSIS — F5101 Primary insomnia: Secondary | ICD-10-CM | POA: Diagnosis not present

## 2020-09-16 DIAGNOSIS — K5712 Diverticulitis of small intestine without perforation or abscess without bleeding: Secondary | ICD-10-CM | POA: Diagnosis not present

## 2020-12-23 DIAGNOSIS — F5101 Primary insomnia: Secondary | ICD-10-CM | POA: Diagnosis not present

## 2020-12-23 DIAGNOSIS — E1143 Type 2 diabetes mellitus with diabetic autonomic (poly)neuropathy: Secondary | ICD-10-CM | POA: Diagnosis not present

## 2020-12-23 DIAGNOSIS — Z0000 Encounter for general adult medical examination without abnormal findings: Secondary | ICD-10-CM | POA: Diagnosis not present

## 2020-12-23 DIAGNOSIS — E785 Hyperlipidemia, unspecified: Secondary | ICD-10-CM | POA: Diagnosis not present

## 2021-03-23 DIAGNOSIS — Z0000 Encounter for general adult medical examination without abnormal findings: Secondary | ICD-10-CM | POA: Diagnosis not present

## 2021-03-23 DIAGNOSIS — F5101 Primary insomnia: Secondary | ICD-10-CM | POA: Diagnosis not present

## 2021-03-23 DIAGNOSIS — E1143 Type 2 diabetes mellitus with diabetic autonomic (poly)neuropathy: Secondary | ICD-10-CM | POA: Diagnosis not present

## 2021-03-23 DIAGNOSIS — E785 Hyperlipidemia, unspecified: Secondary | ICD-10-CM | POA: Diagnosis not present

## 2021-03-27 DIAGNOSIS — E1143 Type 2 diabetes mellitus with diabetic autonomic (poly)neuropathy: Secondary | ICD-10-CM | POA: Diagnosis not present

## 2021-03-27 DIAGNOSIS — E785 Hyperlipidemia, unspecified: Secondary | ICD-10-CM | POA: Diagnosis not present

## 2021-03-27 DIAGNOSIS — F5101 Primary insomnia: Secondary | ICD-10-CM | POA: Diagnosis not present

## 2021-03-27 DIAGNOSIS — Z0000 Encounter for general adult medical examination without abnormal findings: Secondary | ICD-10-CM | POA: Diagnosis not present

## 2021-06-23 DIAGNOSIS — E785 Hyperlipidemia, unspecified: Secondary | ICD-10-CM | POA: Diagnosis not present

## 2021-06-23 DIAGNOSIS — E1143 Type 2 diabetes mellitus with diabetic autonomic (poly)neuropathy: Secondary | ICD-10-CM | POA: Diagnosis not present

## 2021-06-23 DIAGNOSIS — F5101 Primary insomnia: Secondary | ICD-10-CM | POA: Diagnosis not present

## 2021-06-23 DIAGNOSIS — N200 Calculus of kidney: Secondary | ICD-10-CM | POA: Diagnosis not present

## 2021-09-29 DIAGNOSIS — F5101 Primary insomnia: Secondary | ICD-10-CM | POA: Diagnosis not present

## 2021-09-29 DIAGNOSIS — E785 Hyperlipidemia, unspecified: Secondary | ICD-10-CM | POA: Diagnosis not present

## 2021-09-29 DIAGNOSIS — E1143 Type 2 diabetes mellitus with diabetic autonomic (poly)neuropathy: Secondary | ICD-10-CM | POA: Diagnosis not present

## 2021-09-29 DIAGNOSIS — N200 Calculus of kidney: Secondary | ICD-10-CM | POA: Diagnosis not present

## 2022-01-12 DIAGNOSIS — F5101 Primary insomnia: Secondary | ICD-10-CM | POA: Diagnosis not present

## 2022-01-12 DIAGNOSIS — E785 Hyperlipidemia, unspecified: Secondary | ICD-10-CM | POA: Diagnosis not present

## 2022-01-12 DIAGNOSIS — N200 Calculus of kidney: Secondary | ICD-10-CM | POA: Diagnosis not present

## 2022-01-12 DIAGNOSIS — E1143 Type 2 diabetes mellitus with diabetic autonomic (poly)neuropathy: Secondary | ICD-10-CM | POA: Diagnosis not present

## 2022-01-12 DIAGNOSIS — Z0000 Encounter for general adult medical examination without abnormal findings: Secondary | ICD-10-CM | POA: Diagnosis not present

## 2022-02-09 DIAGNOSIS — R1032 Left lower quadrant pain: Secondary | ICD-10-CM | POA: Diagnosis not present

## 2022-04-13 DIAGNOSIS — I10 Essential (primary) hypertension: Secondary | ICD-10-CM | POA: Diagnosis not present

## 2022-04-13 DIAGNOSIS — L309 Dermatitis, unspecified: Secondary | ICD-10-CM | POA: Diagnosis not present

## 2022-04-13 DIAGNOSIS — E1143 Type 2 diabetes mellitus with diabetic autonomic (poly)neuropathy: Secondary | ICD-10-CM | POA: Diagnosis not present

## 2022-07-13 DIAGNOSIS — I10 Essential (primary) hypertension: Secondary | ICD-10-CM | POA: Diagnosis not present

## 2022-07-13 DIAGNOSIS — E1143 Type 2 diabetes mellitus with diabetic autonomic (poly)neuropathy: Secondary | ICD-10-CM | POA: Diagnosis not present

## 2022-07-13 DIAGNOSIS — Z0000 Encounter for general adult medical examination without abnormal findings: Secondary | ICD-10-CM | POA: Diagnosis not present

## 2022-10-12 DIAGNOSIS — I10 Essential (primary) hypertension: Secondary | ICD-10-CM | POA: Diagnosis not present

## 2022-10-12 DIAGNOSIS — E1143 Type 2 diabetes mellitus with diabetic autonomic (poly)neuropathy: Secondary | ICD-10-CM | POA: Diagnosis not present

## 2022-10-15 DIAGNOSIS — E1143 Type 2 diabetes mellitus with diabetic autonomic (poly)neuropathy: Secondary | ICD-10-CM | POA: Diagnosis not present

## 2022-10-15 DIAGNOSIS — E782 Mixed hyperlipidemia: Secondary | ICD-10-CM | POA: Diagnosis not present

## 2022-10-15 DIAGNOSIS — I10 Essential (primary) hypertension: Secondary | ICD-10-CM | POA: Diagnosis not present

## 2022-12-16 DIAGNOSIS — L309 Dermatitis, unspecified: Secondary | ICD-10-CM | POA: Diagnosis not present

## 2022-12-16 DIAGNOSIS — E1143 Type 2 diabetes mellitus with diabetic autonomic (poly)neuropathy: Secondary | ICD-10-CM | POA: Diagnosis not present

## 2022-12-16 DIAGNOSIS — Z0000 Encounter for general adult medical examination without abnormal findings: Secondary | ICD-10-CM | POA: Diagnosis not present

## 2022-12-16 DIAGNOSIS — I10 Essential (primary) hypertension: Secondary | ICD-10-CM | POA: Diagnosis not present

## 2023-03-23 DIAGNOSIS — E1143 Type 2 diabetes mellitus with diabetic autonomic (poly)neuropathy: Secondary | ICD-10-CM | POA: Diagnosis not present

## 2023-03-23 DIAGNOSIS — F330 Major depressive disorder, recurrent, mild: Secondary | ICD-10-CM | POA: Diagnosis not present

## 2023-03-23 DIAGNOSIS — Z0000 Encounter for general adult medical examination without abnormal findings: Secondary | ICD-10-CM | POA: Diagnosis not present

## 2023-03-23 DIAGNOSIS — I10 Essential (primary) hypertension: Secondary | ICD-10-CM | POA: Diagnosis not present

## 2023-03-23 DIAGNOSIS — N10 Acute tubulo-interstitial nephritis: Secondary | ICD-10-CM | POA: Diagnosis not present

## 2023-03-28 DIAGNOSIS — N200 Calculus of kidney: Secondary | ICD-10-CM | POA: Diagnosis not present

## 2023-03-28 DIAGNOSIS — R109 Unspecified abdominal pain: Secondary | ICD-10-CM | POA: Diagnosis not present

## 2023-03-28 DIAGNOSIS — N10 Acute tubulo-interstitial nephritis: Secondary | ICD-10-CM | POA: Diagnosis not present

## 2023-03-28 DIAGNOSIS — K5730 Diverticulosis of large intestine without perforation or abscess without bleeding: Secondary | ICD-10-CM | POA: Diagnosis not present

## 2023-03-28 DIAGNOSIS — I700 Atherosclerosis of aorta: Secondary | ICD-10-CM | POA: Diagnosis not present

## 2023-04-19 DIAGNOSIS — Z6834 Body mass index (BMI) 34.0-34.9, adult: Secondary | ICD-10-CM | POA: Diagnosis not present

## 2023-04-19 DIAGNOSIS — I10 Essential (primary) hypertension: Secondary | ICD-10-CM | POA: Diagnosis not present

## 2023-04-19 DIAGNOSIS — S93492A Sprain of other ligament of left ankle, initial encounter: Secondary | ICD-10-CM | POA: Diagnosis not present

## 2023-04-26 DIAGNOSIS — M79676 Pain in unspecified toe(s): Secondary | ICD-10-CM | POA: Diagnosis not present

## 2023-04-26 DIAGNOSIS — B351 Tinea unguium: Secondary | ICD-10-CM | POA: Diagnosis not present

## 2023-05-10 DIAGNOSIS — Z6834 Body mass index (BMI) 34.0-34.9, adult: Secondary | ICD-10-CM | POA: Diagnosis not present

## 2023-05-10 DIAGNOSIS — S93492D Sprain of other ligament of left ankle, subsequent encounter: Secondary | ICD-10-CM | POA: Diagnosis not present

## 2023-05-10 DIAGNOSIS — I10 Essential (primary) hypertension: Secondary | ICD-10-CM | POA: Diagnosis not present

## 2023-06-22 DIAGNOSIS — E1143 Type 2 diabetes mellitus with diabetic autonomic (poly)neuropathy: Secondary | ICD-10-CM | POA: Diagnosis not present

## 2023-06-22 DIAGNOSIS — I700 Atherosclerosis of aorta: Secondary | ICD-10-CM | POA: Diagnosis not present

## 2023-06-22 DIAGNOSIS — F330 Major depressive disorder, recurrent, mild: Secondary | ICD-10-CM | POA: Diagnosis not present

## 2023-06-22 DIAGNOSIS — I10 Essential (primary) hypertension: Secondary | ICD-10-CM | POA: Diagnosis not present

## 2023-07-26 DIAGNOSIS — M79676 Pain in unspecified toe(s): Secondary | ICD-10-CM | POA: Diagnosis not present

## 2023-07-26 DIAGNOSIS — B351 Tinea unguium: Secondary | ICD-10-CM | POA: Diagnosis not present

## 2023-10-19 DIAGNOSIS — M5442 Lumbago with sciatica, left side: Secondary | ICD-10-CM | POA: Diagnosis not present

## 2023-10-19 DIAGNOSIS — I10 Essential (primary) hypertension: Secondary | ICD-10-CM | POA: Diagnosis not present

## 2023-10-19 DIAGNOSIS — E1143 Type 2 diabetes mellitus with diabetic autonomic (poly)neuropathy: Secondary | ICD-10-CM | POA: Diagnosis not present

## 2023-10-19 DIAGNOSIS — F330 Major depressive disorder, recurrent, mild: Secondary | ICD-10-CM | POA: Diagnosis not present

## 2023-10-19 DIAGNOSIS — I700 Atherosclerosis of aorta: Secondary | ICD-10-CM | POA: Diagnosis not present

## 2024-01-02 DIAGNOSIS — I700 Atherosclerosis of aorta: Secondary | ICD-10-CM | POA: Diagnosis not present

## 2024-01-02 DIAGNOSIS — M5442 Lumbago with sciatica, left side: Secondary | ICD-10-CM | POA: Diagnosis not present

## 2024-01-02 DIAGNOSIS — F330 Major depressive disorder, recurrent, mild: Secondary | ICD-10-CM | POA: Diagnosis not present

## 2024-01-02 DIAGNOSIS — Z9181 History of falling: Secondary | ICD-10-CM | POA: Diagnosis not present

## 2024-01-02 DIAGNOSIS — I10 Essential (primary) hypertension: Secondary | ICD-10-CM | POA: Diagnosis not present

## 2024-01-02 DIAGNOSIS — E1143 Type 2 diabetes mellitus with diabetic autonomic (poly)neuropathy: Secondary | ICD-10-CM | POA: Diagnosis not present

## 2024-01-02 DIAGNOSIS — Z7984 Long term (current) use of oral hypoglycemic drugs: Secondary | ICD-10-CM | POA: Diagnosis not present

## 2024-01-05 DIAGNOSIS — I10 Essential (primary) hypertension: Secondary | ICD-10-CM | POA: Diagnosis not present

## 2024-01-05 DIAGNOSIS — E1143 Type 2 diabetes mellitus with diabetic autonomic (poly)neuropathy: Secondary | ICD-10-CM | POA: Diagnosis not present

## 2024-01-05 DIAGNOSIS — M5442 Lumbago with sciatica, left side: Secondary | ICD-10-CM | POA: Diagnosis not present

## 2024-01-05 DIAGNOSIS — F330 Major depressive disorder, recurrent, mild: Secondary | ICD-10-CM | POA: Diagnosis not present

## 2024-01-05 DIAGNOSIS — Z7984 Long term (current) use of oral hypoglycemic drugs: Secondary | ICD-10-CM | POA: Diagnosis not present

## 2024-01-05 DIAGNOSIS — Z9181 History of falling: Secondary | ICD-10-CM | POA: Diagnosis not present

## 2024-01-05 DIAGNOSIS — I700 Atherosclerosis of aorta: Secondary | ICD-10-CM | POA: Diagnosis not present

## 2024-01-10 DIAGNOSIS — Z7984 Long term (current) use of oral hypoglycemic drugs: Secondary | ICD-10-CM | POA: Diagnosis not present

## 2024-01-10 DIAGNOSIS — I700 Atherosclerosis of aorta: Secondary | ICD-10-CM | POA: Diagnosis not present

## 2024-01-10 DIAGNOSIS — I10 Essential (primary) hypertension: Secondary | ICD-10-CM | POA: Diagnosis not present

## 2024-01-10 DIAGNOSIS — Z9181 History of falling: Secondary | ICD-10-CM | POA: Diagnosis not present

## 2024-01-10 DIAGNOSIS — M5442 Lumbago with sciatica, left side: Secondary | ICD-10-CM | POA: Diagnosis not present

## 2024-01-10 DIAGNOSIS — F330 Major depressive disorder, recurrent, mild: Secondary | ICD-10-CM | POA: Diagnosis not present

## 2024-01-10 DIAGNOSIS — E1143 Type 2 diabetes mellitus with diabetic autonomic (poly)neuropathy: Secondary | ICD-10-CM | POA: Diagnosis not present

## 2024-01-13 DIAGNOSIS — F330 Major depressive disorder, recurrent, mild: Secondary | ICD-10-CM | POA: Diagnosis not present

## 2024-01-13 DIAGNOSIS — E1143 Type 2 diabetes mellitus with diabetic autonomic (poly)neuropathy: Secondary | ICD-10-CM | POA: Diagnosis not present

## 2024-01-13 DIAGNOSIS — M5442 Lumbago with sciatica, left side: Secondary | ICD-10-CM | POA: Diagnosis not present

## 2024-01-13 DIAGNOSIS — Z7984 Long term (current) use of oral hypoglycemic drugs: Secondary | ICD-10-CM | POA: Diagnosis not present

## 2024-01-13 DIAGNOSIS — I700 Atherosclerosis of aorta: Secondary | ICD-10-CM | POA: Diagnosis not present

## 2024-01-13 DIAGNOSIS — Z9181 History of falling: Secondary | ICD-10-CM | POA: Diagnosis not present

## 2024-01-13 DIAGNOSIS — I10 Essential (primary) hypertension: Secondary | ICD-10-CM | POA: Diagnosis not present

## 2024-01-17 DIAGNOSIS — F330 Major depressive disorder, recurrent, mild: Secondary | ICD-10-CM | POA: Diagnosis not present

## 2024-01-17 DIAGNOSIS — M5442 Lumbago with sciatica, left side: Secondary | ICD-10-CM | POA: Diagnosis not present

## 2024-01-17 DIAGNOSIS — E1143 Type 2 diabetes mellitus with diabetic autonomic (poly)neuropathy: Secondary | ICD-10-CM | POA: Diagnosis not present

## 2024-01-17 DIAGNOSIS — I10 Essential (primary) hypertension: Secondary | ICD-10-CM | POA: Diagnosis not present

## 2024-01-17 DIAGNOSIS — Z7984 Long term (current) use of oral hypoglycemic drugs: Secondary | ICD-10-CM | POA: Diagnosis not present

## 2024-01-17 DIAGNOSIS — Z9181 History of falling: Secondary | ICD-10-CM | POA: Diagnosis not present

## 2024-01-17 DIAGNOSIS — I700 Atherosclerosis of aorta: Secondary | ICD-10-CM | POA: Diagnosis not present

## 2024-01-18 DIAGNOSIS — I5033 Acute on chronic diastolic (congestive) heart failure: Secondary | ICD-10-CM | POA: Diagnosis not present

## 2024-01-19 DIAGNOSIS — M5442 Lumbago with sciatica, left side: Secondary | ICD-10-CM | POA: Diagnosis not present

## 2024-01-19 DIAGNOSIS — I10 Essential (primary) hypertension: Secondary | ICD-10-CM | POA: Diagnosis not present

## 2024-01-19 DIAGNOSIS — I700 Atherosclerosis of aorta: Secondary | ICD-10-CM | POA: Diagnosis not present

## 2024-01-19 DIAGNOSIS — E1143 Type 2 diabetes mellitus with diabetic autonomic (poly)neuropathy: Secondary | ICD-10-CM | POA: Diagnosis not present

## 2024-01-19 DIAGNOSIS — F330 Major depressive disorder, recurrent, mild: Secondary | ICD-10-CM | POA: Diagnosis not present

## 2024-01-19 DIAGNOSIS — Z7984 Long term (current) use of oral hypoglycemic drugs: Secondary | ICD-10-CM | POA: Diagnosis not present

## 2024-01-19 DIAGNOSIS — Z9181 History of falling: Secondary | ICD-10-CM | POA: Diagnosis not present

## 2024-01-24 DIAGNOSIS — Z9181 History of falling: Secondary | ICD-10-CM | POA: Diagnosis not present

## 2024-01-24 DIAGNOSIS — E1143 Type 2 diabetes mellitus with diabetic autonomic (poly)neuropathy: Secondary | ICD-10-CM | POA: Diagnosis not present

## 2024-01-24 DIAGNOSIS — F330 Major depressive disorder, recurrent, mild: Secondary | ICD-10-CM | POA: Diagnosis not present

## 2024-01-24 DIAGNOSIS — Z7984 Long term (current) use of oral hypoglycemic drugs: Secondary | ICD-10-CM | POA: Diagnosis not present

## 2024-01-24 DIAGNOSIS — I10 Essential (primary) hypertension: Secondary | ICD-10-CM | POA: Diagnosis not present

## 2024-01-24 DIAGNOSIS — M5442 Lumbago with sciatica, left side: Secondary | ICD-10-CM | POA: Diagnosis not present

## 2024-01-24 DIAGNOSIS — I700 Atherosclerosis of aorta: Secondary | ICD-10-CM | POA: Diagnosis not present

## 2024-01-26 DIAGNOSIS — M5442 Lumbago with sciatica, left side: Secondary | ICD-10-CM | POA: Diagnosis not present

## 2024-01-26 DIAGNOSIS — Z7984 Long term (current) use of oral hypoglycemic drugs: Secondary | ICD-10-CM | POA: Diagnosis not present

## 2024-01-26 DIAGNOSIS — Z9181 History of falling: Secondary | ICD-10-CM | POA: Diagnosis not present

## 2024-01-26 DIAGNOSIS — I10 Essential (primary) hypertension: Secondary | ICD-10-CM | POA: Diagnosis not present

## 2024-01-26 DIAGNOSIS — E1143 Type 2 diabetes mellitus with diabetic autonomic (poly)neuropathy: Secondary | ICD-10-CM | POA: Diagnosis not present

## 2024-01-26 DIAGNOSIS — F330 Major depressive disorder, recurrent, mild: Secondary | ICD-10-CM | POA: Diagnosis not present

## 2024-01-26 DIAGNOSIS — I700 Atherosclerosis of aorta: Secondary | ICD-10-CM | POA: Diagnosis not present

## 2024-02-01 DIAGNOSIS — E1143 Type 2 diabetes mellitus with diabetic autonomic (poly)neuropathy: Secondary | ICD-10-CM | POA: Diagnosis not present

## 2024-02-01 DIAGNOSIS — I10 Essential (primary) hypertension: Secondary | ICD-10-CM | POA: Diagnosis not present

## 2024-02-01 DIAGNOSIS — Z7984 Long term (current) use of oral hypoglycemic drugs: Secondary | ICD-10-CM | POA: Diagnosis not present

## 2024-02-01 DIAGNOSIS — Z9181 History of falling: Secondary | ICD-10-CM | POA: Diagnosis not present

## 2024-02-01 DIAGNOSIS — F330 Major depressive disorder, recurrent, mild: Secondary | ICD-10-CM | POA: Diagnosis not present

## 2024-02-01 DIAGNOSIS — I700 Atherosclerosis of aorta: Secondary | ICD-10-CM | POA: Diagnosis not present

## 2024-02-01 DIAGNOSIS — M5442 Lumbago with sciatica, left side: Secondary | ICD-10-CM | POA: Diagnosis not present

## 2024-02-03 DIAGNOSIS — E1143 Type 2 diabetes mellitus with diabetic autonomic (poly)neuropathy: Secondary | ICD-10-CM | POA: Diagnosis not present

## 2024-02-03 DIAGNOSIS — Z9181 History of falling: Secondary | ICD-10-CM | POA: Diagnosis not present

## 2024-02-03 DIAGNOSIS — I700 Atherosclerosis of aorta: Secondary | ICD-10-CM | POA: Diagnosis not present

## 2024-02-03 DIAGNOSIS — I10 Essential (primary) hypertension: Secondary | ICD-10-CM | POA: Diagnosis not present

## 2024-02-03 DIAGNOSIS — M5442 Lumbago with sciatica, left side: Secondary | ICD-10-CM | POA: Diagnosis not present

## 2024-02-03 DIAGNOSIS — F330 Major depressive disorder, recurrent, mild: Secondary | ICD-10-CM | POA: Diagnosis not present

## 2024-02-03 DIAGNOSIS — Z7984 Long term (current) use of oral hypoglycemic drugs: Secondary | ICD-10-CM | POA: Diagnosis not present

## 2024-02-07 DIAGNOSIS — I700 Atherosclerosis of aorta: Secondary | ICD-10-CM | POA: Diagnosis not present

## 2024-02-07 DIAGNOSIS — E1143 Type 2 diabetes mellitus with diabetic autonomic (poly)neuropathy: Secondary | ICD-10-CM | POA: Diagnosis not present

## 2024-02-07 DIAGNOSIS — F330 Major depressive disorder, recurrent, mild: Secondary | ICD-10-CM | POA: Diagnosis not present

## 2024-02-07 DIAGNOSIS — M5442 Lumbago with sciatica, left side: Secondary | ICD-10-CM | POA: Diagnosis not present

## 2024-02-07 DIAGNOSIS — Z7984 Long term (current) use of oral hypoglycemic drugs: Secondary | ICD-10-CM | POA: Diagnosis not present

## 2024-02-07 DIAGNOSIS — Z9181 History of falling: Secondary | ICD-10-CM | POA: Diagnosis not present

## 2024-02-07 DIAGNOSIS — I10 Essential (primary) hypertension: Secondary | ICD-10-CM | POA: Diagnosis not present

## 2024-02-13 DIAGNOSIS — Z9181 History of falling: Secondary | ICD-10-CM | POA: Diagnosis not present

## 2024-02-13 DIAGNOSIS — Z7984 Long term (current) use of oral hypoglycemic drugs: Secondary | ICD-10-CM | POA: Diagnosis not present

## 2024-02-13 DIAGNOSIS — I10 Essential (primary) hypertension: Secondary | ICD-10-CM | POA: Diagnosis not present

## 2024-02-13 DIAGNOSIS — F330 Major depressive disorder, recurrent, mild: Secondary | ICD-10-CM | POA: Diagnosis not present

## 2024-02-13 DIAGNOSIS — E1143 Type 2 diabetes mellitus with diabetic autonomic (poly)neuropathy: Secondary | ICD-10-CM | POA: Diagnosis not present

## 2024-02-13 DIAGNOSIS — I700 Atherosclerosis of aorta: Secondary | ICD-10-CM | POA: Diagnosis not present

## 2024-02-13 DIAGNOSIS — M5442 Lumbago with sciatica, left side: Secondary | ICD-10-CM | POA: Diagnosis not present

## 2024-02-18 DIAGNOSIS — E871 Hypo-osmolality and hyponatremia: Secondary | ICD-10-CM | POA: Diagnosis not present

## 2024-02-18 DIAGNOSIS — N138 Other obstructive and reflux uropathy: Secondary | ICD-10-CM | POA: Diagnosis not present

## 2024-02-18 DIAGNOSIS — K5909 Other constipation: Secondary | ICD-10-CM | POA: Diagnosis not present

## 2024-02-18 DIAGNOSIS — Z7984 Long term (current) use of oral hypoglycemic drugs: Secondary | ICD-10-CM | POA: Diagnosis not present

## 2024-02-18 DIAGNOSIS — R9341 Abnormal radiologic findings on diagnostic imaging of renal pelvis, ureter, or bladder: Secondary | ICD-10-CM | POA: Diagnosis not present

## 2024-02-18 DIAGNOSIS — G9389 Other specified disorders of brain: Secondary | ICD-10-CM | POA: Diagnosis not present

## 2024-02-18 DIAGNOSIS — K5900 Constipation, unspecified: Secondary | ICD-10-CM | POA: Diagnosis not present

## 2024-02-18 DIAGNOSIS — N3000 Acute cystitis without hematuria: Secondary | ICD-10-CM | POA: Diagnosis not present

## 2024-02-18 DIAGNOSIS — E1165 Type 2 diabetes mellitus with hyperglycemia: Secondary | ICD-10-CM | POA: Diagnosis not present

## 2024-02-18 DIAGNOSIS — G8929 Other chronic pain: Secondary | ICD-10-CM | POA: Diagnosis not present

## 2024-02-18 DIAGNOSIS — R41841 Cognitive communication deficit: Secondary | ICD-10-CM | POA: Diagnosis not present

## 2024-02-18 DIAGNOSIS — R29898 Other symptoms and signs involving the musculoskeletal system: Secondary | ICD-10-CM | POA: Diagnosis not present

## 2024-02-18 DIAGNOSIS — M6281 Muscle weakness (generalized): Secondary | ICD-10-CM | POA: Diagnosis not present

## 2024-02-18 DIAGNOSIS — J301 Allergic rhinitis due to pollen: Secondary | ICD-10-CM | POA: Diagnosis not present

## 2024-02-18 DIAGNOSIS — R262 Difficulty in walking, not elsewhere classified: Secondary | ICD-10-CM | POA: Diagnosis not present

## 2024-02-18 DIAGNOSIS — Z887 Allergy status to serum and vaccine status: Secondary | ICD-10-CM | POA: Diagnosis not present

## 2024-02-18 DIAGNOSIS — R531 Weakness: Secondary | ICD-10-CM | POA: Diagnosis not present

## 2024-02-18 DIAGNOSIS — R2689 Other abnormalities of gait and mobility: Secondary | ICD-10-CM | POA: Diagnosis not present

## 2024-02-18 DIAGNOSIS — N401 Enlarged prostate with lower urinary tract symptoms: Secondary | ICD-10-CM | POA: Diagnosis not present

## 2024-02-18 DIAGNOSIS — M62521 Muscle wasting and atrophy, not elsewhere classified, right upper arm: Secondary | ICD-10-CM | POA: Diagnosis not present

## 2024-02-18 DIAGNOSIS — N3001 Acute cystitis with hematuria: Secondary | ICD-10-CM | POA: Diagnosis not present

## 2024-02-18 DIAGNOSIS — R260 Ataxic gait: Secondary | ICD-10-CM | POA: Diagnosis not present

## 2024-02-18 DIAGNOSIS — Z882 Allergy status to sulfonamides status: Secondary | ICD-10-CM | POA: Diagnosis not present

## 2024-02-18 DIAGNOSIS — Z960 Presence of urogenital implants: Secondary | ICD-10-CM | POA: Diagnosis not present

## 2024-02-18 DIAGNOSIS — B9620 Unspecified Escherichia coli [E. coli] as the cause of diseases classified elsewhere: Secondary | ICD-10-CM | POA: Diagnosis not present

## 2024-02-18 DIAGNOSIS — N390 Urinary tract infection, site not specified: Secondary | ICD-10-CM | POA: Diagnosis not present

## 2024-02-18 DIAGNOSIS — Z79899 Other long term (current) drug therapy: Secondary | ICD-10-CM | POA: Diagnosis not present

## 2024-02-18 DIAGNOSIS — R296 Repeated falls: Secondary | ICD-10-CM | POA: Diagnosis not present

## 2024-02-18 DIAGNOSIS — G459 Transient cerebral ischemic attack, unspecified: Secondary | ICD-10-CM | POA: Diagnosis not present

## 2024-02-18 DIAGNOSIS — E119 Type 2 diabetes mellitus without complications: Secondary | ICD-10-CM | POA: Diagnosis not present

## 2024-02-18 DIAGNOSIS — R509 Fever, unspecified: Secondary | ICD-10-CM | POA: Diagnosis not present

## 2024-02-18 DIAGNOSIS — M62562 Muscle wasting and atrophy, not elsewhere classified, left lower leg: Secondary | ICD-10-CM | POA: Diagnosis not present

## 2024-02-18 DIAGNOSIS — Z794 Long term (current) use of insulin: Secondary | ICD-10-CM | POA: Diagnosis not present

## 2024-02-18 DIAGNOSIS — R739 Hyperglycemia, unspecified: Secondary | ICD-10-CM | POA: Diagnosis not present

## 2024-02-18 DIAGNOSIS — N3289 Other specified disorders of bladder: Secondary | ICD-10-CM | POA: Diagnosis not present

## 2024-02-18 DIAGNOSIS — R338 Other retention of urine: Secondary | ICD-10-CM | POA: Diagnosis not present

## 2024-02-18 DIAGNOSIS — G4700 Insomnia, unspecified: Secondary | ICD-10-CM | POA: Diagnosis not present

## 2024-02-18 DIAGNOSIS — Z7982 Long term (current) use of aspirin: Secondary | ICD-10-CM | POA: Diagnosis not present

## 2024-02-18 DIAGNOSIS — Z87440 Personal history of urinary (tract) infections: Secondary | ICD-10-CM | POA: Diagnosis not present

## 2024-02-18 DIAGNOSIS — M62561 Muscle wasting and atrophy, not elsewhere classified, right lower leg: Secondary | ICD-10-CM | POA: Diagnosis not present

## 2024-02-18 DIAGNOSIS — I10 Essential (primary) hypertension: Secondary | ICD-10-CM | POA: Diagnosis not present

## 2024-02-18 DIAGNOSIS — R339 Retention of urine, unspecified: Secondary | ICD-10-CM | POA: Diagnosis not present

## 2024-02-24 DIAGNOSIS — G8929 Other chronic pain: Secondary | ICD-10-CM | POA: Diagnosis not present

## 2024-02-24 DIAGNOSIS — R338 Other retention of urine: Secondary | ICD-10-CM | POA: Diagnosis not present

## 2024-02-24 DIAGNOSIS — R29898 Other symptoms and signs involving the musculoskeletal system: Secondary | ICD-10-CM | POA: Diagnosis not present

## 2024-02-24 DIAGNOSIS — G4700 Insomnia, unspecified: Secondary | ICD-10-CM | POA: Diagnosis not present

## 2024-02-24 DIAGNOSIS — K5900 Constipation, unspecified: Secondary | ICD-10-CM | POA: Diagnosis not present

## 2024-02-24 DIAGNOSIS — M62561 Muscle wasting and atrophy, not elsewhere classified, right lower leg: Secondary | ICD-10-CM | POA: Diagnosis not present

## 2024-02-24 DIAGNOSIS — R296 Repeated falls: Secondary | ICD-10-CM | POA: Diagnosis not present

## 2024-02-24 DIAGNOSIS — M62521 Muscle wasting and atrophy, not elsewhere classified, right upper arm: Secondary | ICD-10-CM | POA: Diagnosis not present

## 2024-02-24 DIAGNOSIS — N3000 Acute cystitis without hematuria: Secondary | ICD-10-CM | POA: Diagnosis not present

## 2024-02-24 DIAGNOSIS — N3289 Other specified disorders of bladder: Secondary | ICD-10-CM | POA: Diagnosis not present

## 2024-02-24 DIAGNOSIS — E871 Hypo-osmolality and hyponatremia: Secondary | ICD-10-CM | POA: Diagnosis not present

## 2024-02-24 DIAGNOSIS — R262 Difficulty in walking, not elsewhere classified: Secondary | ICD-10-CM | POA: Diagnosis not present

## 2024-02-24 DIAGNOSIS — N401 Enlarged prostate with lower urinary tract symptoms: Secondary | ICD-10-CM | POA: Diagnosis not present

## 2024-02-24 DIAGNOSIS — Z960 Presence of urogenital implants: Secondary | ICD-10-CM | POA: Diagnosis not present

## 2024-02-24 DIAGNOSIS — E1165 Type 2 diabetes mellitus with hyperglycemia: Secondary | ICD-10-CM | POA: Diagnosis not present

## 2024-02-24 DIAGNOSIS — N138 Other obstructive and reflux uropathy: Secondary | ICD-10-CM | POA: Diagnosis not present

## 2024-02-24 DIAGNOSIS — R531 Weakness: Secondary | ICD-10-CM | POA: Diagnosis not present

## 2024-02-24 DIAGNOSIS — I10 Essential (primary) hypertension: Secondary | ICD-10-CM | POA: Diagnosis not present

## 2024-02-24 DIAGNOSIS — R339 Retention of urine, unspecified: Secondary | ICD-10-CM | POA: Diagnosis not present

## 2024-02-24 DIAGNOSIS — J301 Allergic rhinitis due to pollen: Secondary | ICD-10-CM | POA: Diagnosis not present

## 2024-02-24 DIAGNOSIS — M62562 Muscle wasting and atrophy, not elsewhere classified, left lower leg: Secondary | ICD-10-CM | POA: Diagnosis not present

## 2024-02-24 DIAGNOSIS — G459 Transient cerebral ischemic attack, unspecified: Secondary | ICD-10-CM | POA: Diagnosis not present

## 2024-02-24 DIAGNOSIS — R41841 Cognitive communication deficit: Secondary | ICD-10-CM | POA: Diagnosis not present

## 2024-02-24 DIAGNOSIS — R260 Ataxic gait: Secondary | ICD-10-CM | POA: Diagnosis not present

## 2024-02-24 DIAGNOSIS — R2689 Other abnormalities of gait and mobility: Secondary | ICD-10-CM | POA: Diagnosis not present

## 2024-02-26 DIAGNOSIS — R531 Weakness: Secondary | ICD-10-CM | POA: Diagnosis not present

## 2024-02-26 DIAGNOSIS — E871 Hypo-osmolality and hyponatremia: Secondary | ICD-10-CM | POA: Diagnosis not present

## 2024-02-26 DIAGNOSIS — R339 Retention of urine, unspecified: Secondary | ICD-10-CM | POA: Diagnosis not present

## 2024-02-26 DIAGNOSIS — K5900 Constipation, unspecified: Secondary | ICD-10-CM | POA: Diagnosis not present

## 2024-03-15 DIAGNOSIS — R531 Weakness: Secondary | ICD-10-CM | POA: Diagnosis not present

## 2024-03-15 DIAGNOSIS — K5900 Constipation, unspecified: Secondary | ICD-10-CM | POA: Diagnosis not present

## 2024-03-15 DIAGNOSIS — E871 Hypo-osmolality and hyponatremia: Secondary | ICD-10-CM | POA: Diagnosis not present

## 2024-03-15 DIAGNOSIS — R339 Retention of urine, unspecified: Secondary | ICD-10-CM | POA: Diagnosis not present

## 2024-03-16 DIAGNOSIS — R338 Other retention of urine: Secondary | ICD-10-CM | POA: Diagnosis not present

## 2024-03-16 DIAGNOSIS — N138 Other obstructive and reflux uropathy: Secondary | ICD-10-CM | POA: Diagnosis not present

## 2024-03-16 DIAGNOSIS — E785 Hyperlipidemia, unspecified: Secondary | ICD-10-CM | POA: Diagnosis not present

## 2024-03-16 DIAGNOSIS — Z6829 Body mass index (BMI) 29.0-29.9, adult: Secondary | ICD-10-CM | POA: Diagnosis not present

## 2024-03-16 DIAGNOSIS — Z556 Problems related to health literacy: Secondary | ICD-10-CM | POA: Diagnosis not present

## 2024-03-16 DIAGNOSIS — B9620 Unspecified Escherichia coli [E. coli] as the cause of diseases classified elsewhere: Secondary | ICD-10-CM | POA: Diagnosis not present

## 2024-03-16 DIAGNOSIS — E1121 Type 2 diabetes mellitus with diabetic nephropathy: Secondary | ICD-10-CM | POA: Diagnosis not present

## 2024-03-16 DIAGNOSIS — K5900 Constipation, unspecified: Secondary | ICD-10-CM | POA: Diagnosis not present

## 2024-03-16 DIAGNOSIS — Z7984 Long term (current) use of oral hypoglycemic drugs: Secondary | ICD-10-CM | POA: Diagnosis not present

## 2024-03-16 DIAGNOSIS — Z7982 Long term (current) use of aspirin: Secondary | ICD-10-CM | POA: Diagnosis not present

## 2024-03-16 DIAGNOSIS — Z8673 Personal history of transient ischemic attack (TIA), and cerebral infarction without residual deficits: Secondary | ICD-10-CM | POA: Diagnosis not present

## 2024-03-16 DIAGNOSIS — Z9181 History of falling: Secondary | ICD-10-CM | POA: Diagnosis not present

## 2024-03-16 DIAGNOSIS — N401 Enlarged prostate with lower urinary tract symptoms: Secondary | ICD-10-CM | POA: Diagnosis not present

## 2024-03-16 DIAGNOSIS — I10 Essential (primary) hypertension: Secondary | ICD-10-CM | POA: Diagnosis not present

## 2024-03-16 DIAGNOSIS — E871 Hypo-osmolality and hyponatremia: Secondary | ICD-10-CM | POA: Diagnosis not present

## 2024-03-16 DIAGNOSIS — F330 Major depressive disorder, recurrent, mild: Secondary | ICD-10-CM | POA: Diagnosis not present

## 2024-03-16 DIAGNOSIS — Z466 Encounter for fitting and adjustment of urinary device: Secondary | ICD-10-CM | POA: Diagnosis not present

## 2024-03-16 DIAGNOSIS — N3000 Acute cystitis without hematuria: Secondary | ICD-10-CM | POA: Diagnosis not present

## 2024-03-16 DIAGNOSIS — E1143 Type 2 diabetes mellitus with diabetic autonomic (poly)neuropathy: Secondary | ICD-10-CM | POA: Diagnosis not present

## 2024-03-16 NOTE — Transitions of Care (Post Inpatient/ED Visit) (Signed)
 03/16/2024  Patient ID: Omar Porter, male   DOB: September 20, 1941, 83 y.o.   MRN: [id number]  Chart Review for transitions of care.  See [facility] for documentation.  Marsa Matteo J. Jenae Tomasello RN, MSN [facility], [facility] RN Care Manager Direct Dial: [phone]  Fax: [phone] Website: [email or web address]

## 2024-03-19 DIAGNOSIS — E1121 Type 2 diabetes mellitus with diabetic nephropathy: Secondary | ICD-10-CM | POA: Diagnosis not present

## 2024-03-19 DIAGNOSIS — F330 Major depressive disorder, recurrent, mild: Secondary | ICD-10-CM | POA: Diagnosis not present

## 2024-03-19 DIAGNOSIS — R338 Other retention of urine: Secondary | ICD-10-CM | POA: Diagnosis not present

## 2024-03-19 DIAGNOSIS — E871 Hypo-osmolality and hyponatremia: Secondary | ICD-10-CM | POA: Diagnosis not present

## 2024-03-19 DIAGNOSIS — E1143 Type 2 diabetes mellitus with diabetic autonomic (poly)neuropathy: Secondary | ICD-10-CM | POA: Diagnosis not present

## 2024-03-19 DIAGNOSIS — B9620 Unspecified Escherichia coli [E. coli] as the cause of diseases classified elsewhere: Secondary | ICD-10-CM | POA: Diagnosis not present

## 2024-03-19 DIAGNOSIS — K5900 Constipation, unspecified: Secondary | ICD-10-CM | POA: Diagnosis not present

## 2024-03-19 DIAGNOSIS — Z9181 History of falling: Secondary | ICD-10-CM | POA: Diagnosis not present

## 2024-03-19 DIAGNOSIS — Z7982 Long term (current) use of aspirin: Secondary | ICD-10-CM | POA: Diagnosis not present

## 2024-03-19 DIAGNOSIS — N401 Enlarged prostate with lower urinary tract symptoms: Secondary | ICD-10-CM | POA: Diagnosis not present

## 2024-03-19 DIAGNOSIS — Z8673 Personal history of transient ischemic attack (TIA), and cerebral infarction without residual deficits: Secondary | ICD-10-CM | POA: Diagnosis not present

## 2024-03-19 DIAGNOSIS — Z556 Problems related to health literacy: Secondary | ICD-10-CM | POA: Diagnosis not present

## 2024-03-19 DIAGNOSIS — N138 Other obstructive and reflux uropathy: Secondary | ICD-10-CM | POA: Diagnosis not present

## 2024-03-19 DIAGNOSIS — E785 Hyperlipidemia, unspecified: Secondary | ICD-10-CM | POA: Diagnosis not present

## 2024-03-19 DIAGNOSIS — Z466 Encounter for fitting and adjustment of urinary device: Secondary | ICD-10-CM | POA: Diagnosis not present

## 2024-03-19 DIAGNOSIS — I10 Essential (primary) hypertension: Secondary | ICD-10-CM | POA: Diagnosis not present

## 2024-03-19 DIAGNOSIS — N3000 Acute cystitis without hematuria: Secondary | ICD-10-CM | POA: Diagnosis not present

## 2024-03-19 DIAGNOSIS — Z6829 Body mass index (BMI) 29.0-29.9, adult: Secondary | ICD-10-CM | POA: Diagnosis not present

## 2024-03-19 DIAGNOSIS — Z7984 Long term (current) use of oral hypoglycemic drugs: Secondary | ICD-10-CM | POA: Diagnosis not present

## 2024-03-21 DIAGNOSIS — E871 Hypo-osmolality and hyponatremia: Secondary | ICD-10-CM | POA: Diagnosis not present

## 2024-03-21 DIAGNOSIS — I5033 Acute on chronic diastolic (congestive) heart failure: Secondary | ICD-10-CM | POA: Diagnosis not present

## 2024-03-21 DIAGNOSIS — A4189 Other specified sepsis: Secondary | ICD-10-CM | POA: Diagnosis not present

## 2024-03-21 DIAGNOSIS — R339 Retention of urine, unspecified: Secondary | ICD-10-CM | POA: Diagnosis not present

## 2024-03-23 DIAGNOSIS — Z8673 Personal history of transient ischemic attack (TIA), and cerebral infarction without residual deficits: Secondary | ICD-10-CM | POA: Diagnosis not present

## 2024-03-23 DIAGNOSIS — I10 Essential (primary) hypertension: Secondary | ICD-10-CM | POA: Diagnosis not present

## 2024-03-23 DIAGNOSIS — F330 Major depressive disorder, recurrent, mild: Secondary | ICD-10-CM | POA: Diagnosis not present

## 2024-03-23 DIAGNOSIS — Z7982 Long term (current) use of aspirin: Secondary | ICD-10-CM | POA: Diagnosis not present

## 2024-03-23 DIAGNOSIS — N3000 Acute cystitis without hematuria: Secondary | ICD-10-CM | POA: Diagnosis not present

## 2024-03-23 DIAGNOSIS — R338 Other retention of urine: Secondary | ICD-10-CM | POA: Diagnosis not present

## 2024-03-23 DIAGNOSIS — E1121 Type 2 diabetes mellitus with diabetic nephropathy: Secondary | ICD-10-CM | POA: Diagnosis not present

## 2024-03-23 DIAGNOSIS — N401 Enlarged prostate with lower urinary tract symptoms: Secondary | ICD-10-CM | POA: Diagnosis not present

## 2024-03-23 DIAGNOSIS — Z556 Problems related to health literacy: Secondary | ICD-10-CM | POA: Diagnosis not present

## 2024-03-23 DIAGNOSIS — E1143 Type 2 diabetes mellitus with diabetic autonomic (poly)neuropathy: Secondary | ICD-10-CM | POA: Diagnosis not present

## 2024-03-23 DIAGNOSIS — Z9181 History of falling: Secondary | ICD-10-CM | POA: Diagnosis not present

## 2024-03-23 DIAGNOSIS — Z7984 Long term (current) use of oral hypoglycemic drugs: Secondary | ICD-10-CM | POA: Diagnosis not present

## 2024-03-23 DIAGNOSIS — N138 Other obstructive and reflux uropathy: Secondary | ICD-10-CM | POA: Diagnosis not present

## 2024-03-23 DIAGNOSIS — E785 Hyperlipidemia, unspecified: Secondary | ICD-10-CM | POA: Diagnosis not present

## 2024-03-23 DIAGNOSIS — Z6829 Body mass index (BMI) 29.0-29.9, adult: Secondary | ICD-10-CM | POA: Diagnosis not present

## 2024-03-23 DIAGNOSIS — K5900 Constipation, unspecified: Secondary | ICD-10-CM | POA: Diagnosis not present

## 2024-03-23 DIAGNOSIS — Z466 Encounter for fitting and adjustment of urinary device: Secondary | ICD-10-CM | POA: Diagnosis not present

## 2024-03-23 DIAGNOSIS — E871 Hypo-osmolality and hyponatremia: Secondary | ICD-10-CM | POA: Diagnosis not present

## 2024-03-23 DIAGNOSIS — B9620 Unspecified Escherichia coli [E. coli] as the cause of diseases classified elsewhere: Secondary | ICD-10-CM | POA: Diagnosis not present

## 2024-04-03 DIAGNOSIS — E1143 Type 2 diabetes mellitus with diabetic autonomic (poly)neuropathy: Secondary | ICD-10-CM | POA: Diagnosis not present

## 2024-04-03 DIAGNOSIS — E871 Hypo-osmolality and hyponatremia: Secondary | ICD-10-CM | POA: Diagnosis not present

## 2024-04-03 DIAGNOSIS — R338 Other retention of urine: Secondary | ICD-10-CM | POA: Diagnosis not present

## 2024-04-03 DIAGNOSIS — Z7982 Long term (current) use of aspirin: Secondary | ICD-10-CM | POA: Diagnosis not present

## 2024-04-03 DIAGNOSIS — E1121 Type 2 diabetes mellitus with diabetic nephropathy: Secondary | ICD-10-CM | POA: Diagnosis not present

## 2024-04-03 DIAGNOSIS — N401 Enlarged prostate with lower urinary tract symptoms: Secondary | ICD-10-CM | POA: Diagnosis not present

## 2024-04-03 DIAGNOSIS — Z556 Problems related to health literacy: Secondary | ICD-10-CM | POA: Diagnosis not present

## 2024-04-03 DIAGNOSIS — Z7984 Long term (current) use of oral hypoglycemic drugs: Secondary | ICD-10-CM | POA: Diagnosis not present

## 2024-04-03 DIAGNOSIS — F330 Major depressive disorder, recurrent, mild: Secondary | ICD-10-CM | POA: Diagnosis not present

## 2024-04-03 DIAGNOSIS — I10 Essential (primary) hypertension: Secondary | ICD-10-CM | POA: Diagnosis not present

## 2024-04-03 DIAGNOSIS — Z6829 Body mass index (BMI) 29.0-29.9, adult: Secondary | ICD-10-CM | POA: Diagnosis not present

## 2024-04-03 DIAGNOSIS — Z9181 History of falling: Secondary | ICD-10-CM | POA: Diagnosis not present

## 2024-04-03 DIAGNOSIS — Z466 Encounter for fitting and adjustment of urinary device: Secondary | ICD-10-CM | POA: Diagnosis not present

## 2024-04-03 DIAGNOSIS — K5900 Constipation, unspecified: Secondary | ICD-10-CM | POA: Diagnosis not present

## 2024-04-03 DIAGNOSIS — Z8673 Personal history of transient ischemic attack (TIA), and cerebral infarction without residual deficits: Secondary | ICD-10-CM | POA: Diagnosis not present

## 2024-04-03 DIAGNOSIS — E785 Hyperlipidemia, unspecified: Secondary | ICD-10-CM | POA: Diagnosis not present

## 2024-04-03 DIAGNOSIS — N3000 Acute cystitis without hematuria: Secondary | ICD-10-CM | POA: Diagnosis not present

## 2024-04-03 DIAGNOSIS — N138 Other obstructive and reflux uropathy: Secondary | ICD-10-CM | POA: Diagnosis not present

## 2024-04-03 DIAGNOSIS — B9620 Unspecified Escherichia coli [E. coli] as the cause of diseases classified elsewhere: Secondary | ICD-10-CM | POA: Diagnosis not present

## 2024-04-06 DIAGNOSIS — E871 Hypo-osmolality and hyponatremia: Secondary | ICD-10-CM | POA: Diagnosis not present

## 2024-04-06 DIAGNOSIS — Z556 Problems related to health literacy: Secondary | ICD-10-CM | POA: Diagnosis not present

## 2024-04-06 DIAGNOSIS — E1143 Type 2 diabetes mellitus with diabetic autonomic (poly)neuropathy: Secondary | ICD-10-CM | POA: Diagnosis not present

## 2024-04-06 DIAGNOSIS — B9620 Unspecified Escherichia coli [E. coli] as the cause of diseases classified elsewhere: Secondary | ICD-10-CM | POA: Diagnosis not present

## 2024-04-06 DIAGNOSIS — N401 Enlarged prostate with lower urinary tract symptoms: Secondary | ICD-10-CM | POA: Diagnosis not present

## 2024-04-06 DIAGNOSIS — N138 Other obstructive and reflux uropathy: Secondary | ICD-10-CM | POA: Diagnosis not present

## 2024-04-06 DIAGNOSIS — Z7982 Long term (current) use of aspirin: Secondary | ICD-10-CM | POA: Diagnosis not present

## 2024-04-06 DIAGNOSIS — Z6829 Body mass index (BMI) 29.0-29.9, adult: Secondary | ICD-10-CM | POA: Diagnosis not present

## 2024-04-06 DIAGNOSIS — Z9181 History of falling: Secondary | ICD-10-CM | POA: Diagnosis not present

## 2024-04-06 DIAGNOSIS — F330 Major depressive disorder, recurrent, mild: Secondary | ICD-10-CM | POA: Diagnosis not present

## 2024-04-06 DIAGNOSIS — Z466 Encounter for fitting and adjustment of urinary device: Secondary | ICD-10-CM | POA: Diagnosis not present

## 2024-04-06 DIAGNOSIS — K5900 Constipation, unspecified: Secondary | ICD-10-CM | POA: Diagnosis not present

## 2024-04-06 DIAGNOSIS — N3000 Acute cystitis without hematuria: Secondary | ICD-10-CM | POA: Diagnosis not present

## 2024-04-06 DIAGNOSIS — Z7984 Long term (current) use of oral hypoglycemic drugs: Secondary | ICD-10-CM | POA: Diagnosis not present

## 2024-04-06 DIAGNOSIS — R338 Other retention of urine: Secondary | ICD-10-CM | POA: Diagnosis not present

## 2024-04-06 DIAGNOSIS — E1121 Type 2 diabetes mellitus with diabetic nephropathy: Secondary | ICD-10-CM | POA: Diagnosis not present

## 2024-04-06 DIAGNOSIS — E785 Hyperlipidemia, unspecified: Secondary | ICD-10-CM | POA: Diagnosis not present

## 2024-04-06 DIAGNOSIS — Z8673 Personal history of transient ischemic attack (TIA), and cerebral infarction without residual deficits: Secondary | ICD-10-CM | POA: Diagnosis not present

## 2024-04-06 DIAGNOSIS — I10 Essential (primary) hypertension: Secondary | ICD-10-CM | POA: Diagnosis not present

## 2024-04-06 DIAGNOSIS — N390 Urinary tract infection, site not specified: Secondary | ICD-10-CM | POA: Diagnosis not present

## 2024-04-12 DIAGNOSIS — Z7984 Long term (current) use of oral hypoglycemic drugs: Secondary | ICD-10-CM | POA: Diagnosis not present

## 2024-04-12 DIAGNOSIS — R338 Other retention of urine: Secondary | ICD-10-CM | POA: Diagnosis not present

## 2024-04-12 DIAGNOSIS — B9620 Unspecified Escherichia coli [E. coli] as the cause of diseases classified elsewhere: Secondary | ICD-10-CM | POA: Diagnosis not present

## 2024-04-12 DIAGNOSIS — I10 Essential (primary) hypertension: Secondary | ICD-10-CM | POA: Diagnosis not present

## 2024-04-12 DIAGNOSIS — Z6829 Body mass index (BMI) 29.0-29.9, adult: Secondary | ICD-10-CM | POA: Diagnosis not present

## 2024-04-12 DIAGNOSIS — N3000 Acute cystitis without hematuria: Secondary | ICD-10-CM | POA: Diagnosis not present

## 2024-04-12 DIAGNOSIS — Z466 Encounter for fitting and adjustment of urinary device: Secondary | ICD-10-CM | POA: Diagnosis not present

## 2024-04-12 DIAGNOSIS — Z9181 History of falling: Secondary | ICD-10-CM | POA: Diagnosis not present

## 2024-04-12 DIAGNOSIS — E785 Hyperlipidemia, unspecified: Secondary | ICD-10-CM | POA: Diagnosis not present

## 2024-04-12 DIAGNOSIS — F330 Major depressive disorder, recurrent, mild: Secondary | ICD-10-CM | POA: Diagnosis not present

## 2024-04-12 DIAGNOSIS — N401 Enlarged prostate with lower urinary tract symptoms: Secondary | ICD-10-CM | POA: Diagnosis not present

## 2024-04-12 DIAGNOSIS — Z7982 Long term (current) use of aspirin: Secondary | ICD-10-CM | POA: Diagnosis not present

## 2024-04-12 DIAGNOSIS — Z556 Problems related to health literacy: Secondary | ICD-10-CM | POA: Diagnosis not present

## 2024-04-12 DIAGNOSIS — E1121 Type 2 diabetes mellitus with diabetic nephropathy: Secondary | ICD-10-CM | POA: Diagnosis not present

## 2024-04-12 DIAGNOSIS — Z8673 Personal history of transient ischemic attack (TIA), and cerebral infarction without residual deficits: Secondary | ICD-10-CM | POA: Diagnosis not present

## 2024-04-12 DIAGNOSIS — E871 Hypo-osmolality and hyponatremia: Secondary | ICD-10-CM | POA: Diagnosis not present

## 2024-04-12 DIAGNOSIS — K5900 Constipation, unspecified: Secondary | ICD-10-CM | POA: Diagnosis not present

## 2024-04-12 DIAGNOSIS — N138 Other obstructive and reflux uropathy: Secondary | ICD-10-CM | POA: Diagnosis not present

## 2024-04-12 DIAGNOSIS — E1143 Type 2 diabetes mellitus with diabetic autonomic (poly)neuropathy: Secondary | ICD-10-CM | POA: Diagnosis not present

## 2024-04-15 DIAGNOSIS — N138 Other obstructive and reflux uropathy: Secondary | ICD-10-CM | POA: Diagnosis not present

## 2024-04-15 DIAGNOSIS — E1143 Type 2 diabetes mellitus with diabetic autonomic (poly)neuropathy: Secondary | ICD-10-CM | POA: Diagnosis not present

## 2024-04-15 DIAGNOSIS — F330 Major depressive disorder, recurrent, mild: Secondary | ICD-10-CM | POA: Diagnosis not present

## 2024-04-15 DIAGNOSIS — I10 Essential (primary) hypertension: Secondary | ICD-10-CM | POA: Diagnosis not present

## 2024-04-15 DIAGNOSIS — Z466 Encounter for fitting and adjustment of urinary device: Secondary | ICD-10-CM | POA: Diagnosis not present

## 2024-04-15 DIAGNOSIS — Z7982 Long term (current) use of aspirin: Secondary | ICD-10-CM | POA: Diagnosis not present

## 2024-04-15 DIAGNOSIS — Z6829 Body mass index (BMI) 29.0-29.9, adult: Secondary | ICD-10-CM | POA: Diagnosis not present

## 2024-04-15 DIAGNOSIS — Z556 Problems related to health literacy: Secondary | ICD-10-CM | POA: Diagnosis not present

## 2024-04-15 DIAGNOSIS — B9620 Unspecified Escherichia coli [E. coli] as the cause of diseases classified elsewhere: Secondary | ICD-10-CM | POA: Diagnosis not present

## 2024-04-15 DIAGNOSIS — K5900 Constipation, unspecified: Secondary | ICD-10-CM | POA: Diagnosis not present

## 2024-04-15 DIAGNOSIS — E871 Hypo-osmolality and hyponatremia: Secondary | ICD-10-CM | POA: Diagnosis not present

## 2024-04-15 DIAGNOSIS — N3000 Acute cystitis without hematuria: Secondary | ICD-10-CM | POA: Diagnosis not present

## 2024-04-15 DIAGNOSIS — N401 Enlarged prostate with lower urinary tract symptoms: Secondary | ICD-10-CM | POA: Diagnosis not present

## 2024-04-15 DIAGNOSIS — Z8673 Personal history of transient ischemic attack (TIA), and cerebral infarction without residual deficits: Secondary | ICD-10-CM | POA: Diagnosis not present

## 2024-04-15 DIAGNOSIS — E785 Hyperlipidemia, unspecified: Secondary | ICD-10-CM | POA: Diagnosis not present

## 2024-04-15 DIAGNOSIS — E1121 Type 2 diabetes mellitus with diabetic nephropathy: Secondary | ICD-10-CM | POA: Diagnosis not present

## 2024-04-15 DIAGNOSIS — R338 Other retention of urine: Secondary | ICD-10-CM | POA: Diagnosis not present

## 2024-04-15 DIAGNOSIS — Z7984 Long term (current) use of oral hypoglycemic drugs: Secondary | ICD-10-CM | POA: Diagnosis not present

## 2024-04-15 DIAGNOSIS — Z9181 History of falling: Secondary | ICD-10-CM | POA: Diagnosis not present

## 2024-04-17 DIAGNOSIS — Z8673 Personal history of transient ischemic attack (TIA), and cerebral infarction without residual deficits: Secondary | ICD-10-CM | POA: Diagnosis not present

## 2024-04-17 DIAGNOSIS — Z6829 Body mass index (BMI) 29.0-29.9, adult: Secondary | ICD-10-CM | POA: Diagnosis not present

## 2024-04-17 DIAGNOSIS — Z556 Problems related to health literacy: Secondary | ICD-10-CM | POA: Diagnosis not present

## 2024-04-17 DIAGNOSIS — K5900 Constipation, unspecified: Secondary | ICD-10-CM | POA: Diagnosis not present

## 2024-04-17 DIAGNOSIS — E1143 Type 2 diabetes mellitus with diabetic autonomic (poly)neuropathy: Secondary | ICD-10-CM | POA: Diagnosis not present

## 2024-04-17 DIAGNOSIS — Z9181 History of falling: Secondary | ICD-10-CM | POA: Diagnosis not present

## 2024-04-17 DIAGNOSIS — N138 Other obstructive and reflux uropathy: Secondary | ICD-10-CM | POA: Diagnosis not present

## 2024-04-17 DIAGNOSIS — N3000 Acute cystitis without hematuria: Secondary | ICD-10-CM | POA: Diagnosis not present

## 2024-04-17 DIAGNOSIS — E871 Hypo-osmolality and hyponatremia: Secondary | ICD-10-CM | POA: Diagnosis not present

## 2024-04-17 DIAGNOSIS — F330 Major depressive disorder, recurrent, mild: Secondary | ICD-10-CM | POA: Diagnosis not present

## 2024-04-17 DIAGNOSIS — N401 Enlarged prostate with lower urinary tract symptoms: Secondary | ICD-10-CM | POA: Diagnosis not present

## 2024-04-17 DIAGNOSIS — Z7984 Long term (current) use of oral hypoglycemic drugs: Secondary | ICD-10-CM | POA: Diagnosis not present

## 2024-04-17 DIAGNOSIS — B9620 Unspecified Escherichia coli [E. coli] as the cause of diseases classified elsewhere: Secondary | ICD-10-CM | POA: Diagnosis not present

## 2024-04-17 DIAGNOSIS — E785 Hyperlipidemia, unspecified: Secondary | ICD-10-CM | POA: Diagnosis not present

## 2024-04-17 DIAGNOSIS — Z7982 Long term (current) use of aspirin: Secondary | ICD-10-CM | POA: Diagnosis not present

## 2024-04-17 DIAGNOSIS — I10 Essential (primary) hypertension: Secondary | ICD-10-CM | POA: Diagnosis not present

## 2024-04-17 DIAGNOSIS — E1121 Type 2 diabetes mellitus with diabetic nephropathy: Secondary | ICD-10-CM | POA: Diagnosis not present

## 2024-04-17 DIAGNOSIS — R338 Other retention of urine: Secondary | ICD-10-CM | POA: Diagnosis not present

## 2024-04-17 DIAGNOSIS — Z466 Encounter for fitting and adjustment of urinary device: Secondary | ICD-10-CM | POA: Diagnosis not present

## 2024-04-17 NOTE — Progress Notes (Unsigned)
 Name: Omar Porter DOB: 17-Jul-1941 MRN: [id number]  History of Present Illness: Omar Porter is a 83 y.o. male who presents today as a new patient at [facility] . All available relevant medical records have been reviewed. Previous seen by Dr. Joie Narrow. Accompanied by his wife Altamease Asters. Relevant History includes: 1. BPH with LUTS. - Takes Proscar 5 mg daily and Terazosin  10 mg daily. - Prior episode of urinary retention in 2016. 2. Kidney stone(s).  Recent history:  > 02/18/2024 - 02/24/2024: - Admitted at [facility] for UTI and right lower extremity weakness. Urine culture grew E. coli. Treated with Ceftriaxone; discharged on oral Cefdinir. Per wife patient had been treated for UTI 3 weeks prior with Cipro . - Developed urinary retention with PVR >600 ml. Foley catheter placed. - Bladder ultrasound on 02/20/2024 showed "Filling defect within the posterior bladder which may represent blood products/debris versus mass. Recommend correlation with urine cytology and possible cystoscopy." Prostate 4.1 x 4.1 x 4.2 cm. - Chronic constipation: Advised to take Miralax  daily.  - Discharged to SNF.  Today: His wife reports that when he was admitted in April 2025 his scrotum was significantly swollen around the penis, which they think was contributory to his inability to empty his bladder. She states that his prior episode of urinary retention in 2016 was similar.   He denies gross hematuria.  He denies flank pain or abdominal pain. He denies fevers, nausea, or vomiting.  States that before recent admission he was having weak intermittent urinary stream.    Medications: Current Outpatient Medications  Medication Sig Dispense Refill   amLODipine  (NORVASC ) 10 MG tablet Take 1 tablet (10 mg total) by mouth daily. 30 tablet 11   aspirin 81 MG chewable tablet Chew 81 mg by mouth daily.      atorvastatin  (LIPITOR) 40 MG tablet Take 40 mg by mouth daily.     cyclobenzaprine   (FLEXERIL ) 5 MG tablet Take 1 tablet (5 mg total) by mouth 3 (three) times daily as needed for muscle spasms. 30 tablet 1   desoximetasone (TOPICORT) 0.25 % cream Apply 1 application topically 2 (two) times daily.     Difluprednate 0.05 % EMUL 1 drop 2 (two) times daily.     finasteride (PROSCAR) 5 MG tablet Take 5 mg by mouth daily.     furosemide (LASIX) 20 MG tablet Take 20 mg by mouth daily.     glimepiride  (AMARYL ) 4 MG tablet Take 4 mg by mouth 2 (two) times daily.     glucose blood (ADVOCATE REDI-CODE) test strip 3 (three) times daily.     hydrocortisone (ANUSOL-HC) 2.5 % rectal cream 1 application 2 (two) times daily.     Lancets MISC by Does not apply route as needed.     lisinopril  (PRINIVIL ,ZESTRIL ) 20 MG tablet Take 1 tablet (20 mg total) by mouth daily. 30 tablet 5   lubiprostone (AMITIZA) 24 MCG capsule Take 24 mcg by mouth 2 (two) times daily with a meal.      metoprolol  tartrate (LOPRESSOR ) 25 MG tablet TAKE 1 TABLET (25 MG TOTAL) BY MOUTH 2 (TWO) TIMES DAILY. 60 tablet 3   montelukast (SINGULAIR) 10 MG tablet TAKE 1 TABLET BY MOUTH AT BEDTIME 30 tablet 1   ofloxacin (OCUFLOX) 0.3 % ophthalmic solution 1 drop daily.     oxyCODONE -acetaminophen  (PERCOCET) 7.5-325 MG per tablet Take 1 tablet by mouth every 8 (eight) hours as needed for pain.     terazosin  (HYTRIN ) 10 MG capsule TAKE 1  CAPSULE TWICE A DAY 60 capsule 5   No current facility-administered medications for this visit.    Allergies: Allergies  Allergen Reactions   Sulfa Antibiotics Diarrhea    Past Medical History:  Diagnosis Date   Allergic rhinitis    Anxiety    Arthritis    Blurred vision    BPH (benign prostatic hyperplasia)    Chronic back pain    Dermatitis    Diabetes mellitus    Diverticulitis    Hyperlipidemia    Hypertension    Hyposomnia, insomnia, or sleeplessness associated with anxiety    Leg pain    Lumbar stress fracture    Severe headache    Syncopal episodes    with vision change     Varicose veins of legs    right    Past Surgical History:  Procedure Laterality Date   None     Family History  Problem Relation Age of Onset   CAD Father    Social History   Socioeconomic History   Marital status: Married    Spouse name: Not on file   Number of children: 2   Years of education: Not on file   Highest education level: Not on file  Occupational History   Not on file  Tobacco Use   Smoking status: Former    Current packs/day: 0.00    Average packs/day: 1.5 packs/day for 20.0 years (30.0 ttl pk-yrs)    Types: Cigarettes    Start date: 11/01/1952    Quit date: 11/01/1972    Years since quitting: 51.4   Smokeless tobacco: Never  Substance and Sexual Activity   Alcohol use: No   Drug use: No   Sexual activity: Not Currently    Birth control/protection: None  Other Topics Concern   Not on file  Social History Narrative   Lives at home with wife.     Social Drivers of Corporate investment banker Strain: Not on file  Food Insecurity: Not on file  Transportation Needs: Not on file  Physical Activity: Not on file  Stress: Not on file  Social Connections: Not on file  Intimate Partner Violence: Not on file    SUBJECTIVE  Review of Systems Constitutional: Patient denies any unintentional weight loss or change in strength lntegumentary: Patient denies any rashes or pruritus Cardiovascular: Patient denies chest pain or syncope Respiratory: Patient denies shortness of breath Gastrointestinal: Patient reports he is having a BM daily (denies constipation) Musculoskeletal: Patient denies muscle cramps or weakness Neurologic: Patient denies convulsions or seizures Allergic/Immunologic: Patient denies recent allergic reaction(s) Hematologic/Lymphatic: Patient denies bleeding tendencies Endocrine: Patient denies heat/cold intolerance  GU: As per HPI.  OBJECTIVE Vitals:   04/18/24 1321  BP: 127/73  Pulse: 65   There is no height or weight on file  to calculate BMI.  Physical Examination Constitutional: No obvious distress; patient is non-toxic appearing  Cardiovascular: No visible lower extremity edema.  Respiratory: The patient does not have audible wheezing/stridor; respirations do not appear labored  Gastrointestinal: Abdomen non-distended Musculoskeletal: Normal ROM of UEs  Skin: No obvious rashes/open sores  Neurologic: CN 2-12 grossly intact Psychiatric: Answered questions appropriately with normal affect  Hematologic/Lymphatic/Immunologic: No obvious bruises or sites of spontaneous bleeding   ASSESSMENT Benign non-nodular prostatic hyperplasia with lower urinary tract symptoms - Plan: INSERT,TEMP INDWELLING BLAD CATH,SIMPLE  Ambulatory dysfunction - Plan: INSERT,TEMP INDWELLING BLAD CATH,SIMPLE  Hospital discharge follow-up - Plan: INSERT,TEMP INDWELLING BLAD CATH,SIMPLE  Urinary retention - Plan: INSERT,TEMP INDWELLING BLAD  CATH,SIMPLE  Foley catheter in place - Plan: INSERT,TEMP INDWELLING BLAD CATH,SIMPLE  We discussed possible etiologies for urinary retention such as: temporary detrusor areflexia s/p surgery or acute injury, over-distention bladder injury, neurogenic bladder, BPH/BOO, clot retention, constipation, anticholinergic medication use, high-tone pelvic floor dysfunction, voiding dyssynergia. Most likely due to his BPH/BOO and constipation.  Advised to continue indwelling Foley catheter for now and proceed with cystoscopy for further evaluation of prostate and possible bladder mass seen on the bladder ultrasound from 02/20/2024. Also to evaluate for possible bladder diverticulum or colovesical fistula.  Patient and wife verbalized understanding of and agreement with current plan. All questions were answered.  PLAN Advised the following: Foley catheter. Continue Proscar 5 mg daily. Continue Terazosin  10 mg daily. Return for 1st available cystoscopy with any urology MD.  Orders Placed This Encounter   Procedures   INSERT,TEMP INDWELLING BLAD CATH,SIMPLE   Total time spent caring for the patient today was over 45 minutes. This includes time spent on the date of the visit reviewing the patient's chart before the visit, time spent during the visit, and time spent after the visit on documentation. Over 50% of that time was spent in face-to-face time with this patient for direct counseling. E&M based on time and complexity of medical decision making.  It has been explained that the patient is to follow regularly with their PCP in addition to all other providers involved in their care and to follow instructions provided by these respective offices. Patient advised to contact urology clinic if any urologic-pertaining questions, concerns, new symptoms or problems arise in the interim period.  There are no Patient Instructions on file for this visit.  Electronically signed by:  Lauretta Ponto, MSN, FNP-C, CUNP 04/18/2024 2:09 PM

## 2024-04-18 ENCOUNTER — Ambulatory Visit (INDEPENDENT_AMBULATORY_CARE_PROVIDER_SITE_OTHER): Payer: Self-pay | Admitting: Urology

## 2024-04-18 ENCOUNTER — Encounter: Payer: Self-pay | Admitting: Urology

## 2024-04-18 VITALS — BP 127/73 | HR 65

## 2024-04-18 DIAGNOSIS — N401 Enlarged prostate with lower urinary tract symptoms: Secondary | ICD-10-CM | POA: Diagnosis not present

## 2024-04-18 DIAGNOSIS — R338 Other retention of urine: Secondary | ICD-10-CM

## 2024-04-18 DIAGNOSIS — R262 Difficulty in walking, not elsewhere classified: Secondary | ICD-10-CM

## 2024-04-18 DIAGNOSIS — Z978 Presence of other specified devices: Secondary | ICD-10-CM

## 2024-04-18 DIAGNOSIS — R339 Retention of urine, unspecified: Secondary | ICD-10-CM

## 2024-04-18 DIAGNOSIS — Z09 Encounter for follow-up examination after completed treatment for conditions other than malignant neoplasm: Secondary | ICD-10-CM

## 2024-04-18 NOTE — Progress Notes (Signed)
 Cath Change/ Replacement  Patient is present today for a catheter change due to urinary retention.  10 ml of water was removed from the balloon, a 16 FR foley cath was removed without difficulty.  Patient was cleaned and prepped in a sterile fashion with Betadinex3.  A 16 FR foley cath was replaced into the bladder, no complications were noted. Urine return was noted 10 ml and urine was Clear yellow in color. The balloon was filled with 10ml of sterile water. A leg bag was attached for drainage.  A night bag was also given to the patient and patient was given instruction on how to change from one bag to another. Patient was given proper instruction on catheter care.    Performed by: Gorden Latino, CMA  Follow up: 4 weeks cath change

## 2024-04-23 DIAGNOSIS — Z9181 History of falling: Secondary | ICD-10-CM | POA: Diagnosis not present

## 2024-04-23 DIAGNOSIS — E1121 Type 2 diabetes mellitus with diabetic nephropathy: Secondary | ICD-10-CM | POA: Diagnosis not present

## 2024-04-23 DIAGNOSIS — N138 Other obstructive and reflux uropathy: Secondary | ICD-10-CM | POA: Diagnosis not present

## 2024-04-23 DIAGNOSIS — Z8673 Personal history of transient ischemic attack (TIA), and cerebral infarction without residual deficits: Secondary | ICD-10-CM | POA: Diagnosis not present

## 2024-04-23 DIAGNOSIS — Z6829 Body mass index (BMI) 29.0-29.9, adult: Secondary | ICD-10-CM | POA: Diagnosis not present

## 2024-04-23 DIAGNOSIS — Z466 Encounter for fitting and adjustment of urinary device: Secondary | ICD-10-CM | POA: Diagnosis not present

## 2024-04-23 DIAGNOSIS — N3000 Acute cystitis without hematuria: Secondary | ICD-10-CM | POA: Diagnosis not present

## 2024-04-23 DIAGNOSIS — E785 Hyperlipidemia, unspecified: Secondary | ICD-10-CM | POA: Diagnosis not present

## 2024-04-23 DIAGNOSIS — F330 Major depressive disorder, recurrent, mild: Secondary | ICD-10-CM | POA: Diagnosis not present

## 2024-04-23 DIAGNOSIS — Z556 Problems related to health literacy: Secondary | ICD-10-CM | POA: Diagnosis not present

## 2024-04-23 DIAGNOSIS — Z7984 Long term (current) use of oral hypoglycemic drugs: Secondary | ICD-10-CM | POA: Diagnosis not present

## 2024-04-23 DIAGNOSIS — I10 Essential (primary) hypertension: Secondary | ICD-10-CM | POA: Diagnosis not present

## 2024-04-23 DIAGNOSIS — N401 Enlarged prostate with lower urinary tract symptoms: Secondary | ICD-10-CM | POA: Diagnosis not present

## 2024-04-23 DIAGNOSIS — Z7982 Long term (current) use of aspirin: Secondary | ICD-10-CM | POA: Diagnosis not present

## 2024-04-23 DIAGNOSIS — K5900 Constipation, unspecified: Secondary | ICD-10-CM | POA: Diagnosis not present

## 2024-04-23 DIAGNOSIS — B9620 Unspecified Escherichia coli [E. coli] as the cause of diseases classified elsewhere: Secondary | ICD-10-CM | POA: Diagnosis not present

## 2024-04-23 DIAGNOSIS — E1143 Type 2 diabetes mellitus with diabetic autonomic (poly)neuropathy: Secondary | ICD-10-CM | POA: Diagnosis not present

## 2024-04-23 DIAGNOSIS — R338 Other retention of urine: Secondary | ICD-10-CM | POA: Diagnosis not present

## 2024-04-23 DIAGNOSIS — E871 Hypo-osmolality and hyponatremia: Secondary | ICD-10-CM | POA: Diagnosis not present

## 2024-04-25 DIAGNOSIS — Z6829 Body mass index (BMI) 29.0-29.9, adult: Secondary | ICD-10-CM | POA: Diagnosis not present

## 2024-04-25 DIAGNOSIS — N3000 Acute cystitis without hematuria: Secondary | ICD-10-CM | POA: Diagnosis not present

## 2024-04-25 DIAGNOSIS — R338 Other retention of urine: Secondary | ICD-10-CM | POA: Diagnosis not present

## 2024-04-25 DIAGNOSIS — Z7984 Long term (current) use of oral hypoglycemic drugs: Secondary | ICD-10-CM | POA: Diagnosis not present

## 2024-04-25 DIAGNOSIS — Z466 Encounter for fitting and adjustment of urinary device: Secondary | ICD-10-CM | POA: Diagnosis not present

## 2024-04-25 DIAGNOSIS — E871 Hypo-osmolality and hyponatremia: Secondary | ICD-10-CM | POA: Diagnosis not present

## 2024-04-25 DIAGNOSIS — N401 Enlarged prostate with lower urinary tract symptoms: Secondary | ICD-10-CM | POA: Diagnosis not present

## 2024-04-25 DIAGNOSIS — K5900 Constipation, unspecified: Secondary | ICD-10-CM | POA: Diagnosis not present

## 2024-04-25 DIAGNOSIS — Z9181 History of falling: Secondary | ICD-10-CM | POA: Diagnosis not present

## 2024-04-25 DIAGNOSIS — Z556 Problems related to health literacy: Secondary | ICD-10-CM | POA: Diagnosis not present

## 2024-04-25 DIAGNOSIS — B9620 Unspecified Escherichia coli [E. coli] as the cause of diseases classified elsewhere: Secondary | ICD-10-CM | POA: Diagnosis not present

## 2024-04-25 DIAGNOSIS — E1143 Type 2 diabetes mellitus with diabetic autonomic (poly)neuropathy: Secondary | ICD-10-CM | POA: Diagnosis not present

## 2024-04-25 DIAGNOSIS — E1121 Type 2 diabetes mellitus with diabetic nephropathy: Secondary | ICD-10-CM | POA: Diagnosis not present

## 2024-04-25 DIAGNOSIS — Z7982 Long term (current) use of aspirin: Secondary | ICD-10-CM | POA: Diagnosis not present

## 2024-04-25 DIAGNOSIS — Z8673 Personal history of transient ischemic attack (TIA), and cerebral infarction without residual deficits: Secondary | ICD-10-CM | POA: Diagnosis not present

## 2024-04-25 DIAGNOSIS — F330 Major depressive disorder, recurrent, mild: Secondary | ICD-10-CM | POA: Diagnosis not present

## 2024-04-25 DIAGNOSIS — E785 Hyperlipidemia, unspecified: Secondary | ICD-10-CM | POA: Diagnosis not present

## 2024-04-25 DIAGNOSIS — I10 Essential (primary) hypertension: Secondary | ICD-10-CM | POA: Diagnosis not present

## 2024-04-25 DIAGNOSIS — N138 Other obstructive and reflux uropathy: Secondary | ICD-10-CM | POA: Diagnosis not present

## 2024-04-30 DIAGNOSIS — I10 Essential (primary) hypertension: Secondary | ICD-10-CM | POA: Diagnosis not present

## 2024-04-30 DIAGNOSIS — Z7984 Long term (current) use of oral hypoglycemic drugs: Secondary | ICD-10-CM | POA: Diagnosis not present

## 2024-04-30 DIAGNOSIS — E1143 Type 2 diabetes mellitus with diabetic autonomic (poly)neuropathy: Secondary | ICD-10-CM | POA: Diagnosis not present

## 2024-04-30 DIAGNOSIS — B9620 Unspecified Escherichia coli [E. coli] as the cause of diseases classified elsewhere: Secondary | ICD-10-CM | POA: Diagnosis not present

## 2024-04-30 DIAGNOSIS — Z466 Encounter for fitting and adjustment of urinary device: Secondary | ICD-10-CM | POA: Diagnosis not present

## 2024-04-30 DIAGNOSIS — E1121 Type 2 diabetes mellitus with diabetic nephropathy: Secondary | ICD-10-CM | POA: Diagnosis not present

## 2024-04-30 DIAGNOSIS — F330 Major depressive disorder, recurrent, mild: Secondary | ICD-10-CM | POA: Diagnosis not present

## 2024-04-30 DIAGNOSIS — R338 Other retention of urine: Secondary | ICD-10-CM | POA: Diagnosis not present

## 2024-04-30 DIAGNOSIS — Z6829 Body mass index (BMI) 29.0-29.9, adult: Secondary | ICD-10-CM | POA: Diagnosis not present

## 2024-04-30 DIAGNOSIS — E785 Hyperlipidemia, unspecified: Secondary | ICD-10-CM | POA: Diagnosis not present

## 2024-04-30 DIAGNOSIS — N138 Other obstructive and reflux uropathy: Secondary | ICD-10-CM | POA: Diagnosis not present

## 2024-04-30 DIAGNOSIS — E871 Hypo-osmolality and hyponatremia: Secondary | ICD-10-CM | POA: Diagnosis not present

## 2024-04-30 DIAGNOSIS — Z556 Problems related to health literacy: Secondary | ICD-10-CM | POA: Diagnosis not present

## 2024-04-30 DIAGNOSIS — K5900 Constipation, unspecified: Secondary | ICD-10-CM | POA: Diagnosis not present

## 2024-04-30 DIAGNOSIS — N401 Enlarged prostate with lower urinary tract symptoms: Secondary | ICD-10-CM | POA: Diagnosis not present

## 2024-04-30 DIAGNOSIS — Z9181 History of falling: Secondary | ICD-10-CM | POA: Diagnosis not present

## 2024-04-30 DIAGNOSIS — N3000 Acute cystitis without hematuria: Secondary | ICD-10-CM | POA: Diagnosis not present

## 2024-04-30 DIAGNOSIS — Z8673 Personal history of transient ischemic attack (TIA), and cerebral infarction without residual deficits: Secondary | ICD-10-CM | POA: Diagnosis not present

## 2024-04-30 DIAGNOSIS — Z7982 Long term (current) use of aspirin: Secondary | ICD-10-CM | POA: Diagnosis not present

## 2024-05-07 DIAGNOSIS — Z556 Problems related to health literacy: Secondary | ICD-10-CM | POA: Diagnosis not present

## 2024-05-07 DIAGNOSIS — I10 Essential (primary) hypertension: Secondary | ICD-10-CM | POA: Diagnosis not present

## 2024-05-07 DIAGNOSIS — N3000 Acute cystitis without hematuria: Secondary | ICD-10-CM | POA: Diagnosis not present

## 2024-05-07 DIAGNOSIS — F330 Major depressive disorder, recurrent, mild: Secondary | ICD-10-CM | POA: Diagnosis not present

## 2024-05-07 DIAGNOSIS — Z8673 Personal history of transient ischemic attack (TIA), and cerebral infarction without residual deficits: Secondary | ICD-10-CM | POA: Diagnosis not present

## 2024-05-07 DIAGNOSIS — E871 Hypo-osmolality and hyponatremia: Secondary | ICD-10-CM | POA: Diagnosis not present

## 2024-05-07 DIAGNOSIS — Z466 Encounter for fitting and adjustment of urinary device: Secondary | ICD-10-CM | POA: Diagnosis not present

## 2024-05-07 DIAGNOSIS — K5900 Constipation, unspecified: Secondary | ICD-10-CM | POA: Diagnosis not present

## 2024-05-07 DIAGNOSIS — Z9181 History of falling: Secondary | ICD-10-CM | POA: Diagnosis not present

## 2024-05-07 DIAGNOSIS — E785 Hyperlipidemia, unspecified: Secondary | ICD-10-CM | POA: Diagnosis not present

## 2024-05-07 DIAGNOSIS — N138 Other obstructive and reflux uropathy: Secondary | ICD-10-CM | POA: Diagnosis not present

## 2024-05-07 DIAGNOSIS — Z7982 Long term (current) use of aspirin: Secondary | ICD-10-CM | POA: Diagnosis not present

## 2024-05-07 DIAGNOSIS — Z6829 Body mass index (BMI) 29.0-29.9, adult: Secondary | ICD-10-CM | POA: Diagnosis not present

## 2024-05-07 DIAGNOSIS — Z7984 Long term (current) use of oral hypoglycemic drugs: Secondary | ICD-10-CM | POA: Diagnosis not present

## 2024-05-07 DIAGNOSIS — N401 Enlarged prostate with lower urinary tract symptoms: Secondary | ICD-10-CM | POA: Diagnosis not present

## 2024-05-07 DIAGNOSIS — E1121 Type 2 diabetes mellitus with diabetic nephropathy: Secondary | ICD-10-CM | POA: Diagnosis not present

## 2024-05-07 DIAGNOSIS — E1143 Type 2 diabetes mellitus with diabetic autonomic (poly)neuropathy: Secondary | ICD-10-CM | POA: Diagnosis not present

## 2024-05-07 DIAGNOSIS — R338 Other retention of urine: Secondary | ICD-10-CM | POA: Diagnosis not present

## 2024-05-07 DIAGNOSIS — B9620 Unspecified Escherichia coli [E. coli] as the cause of diseases classified elsewhere: Secondary | ICD-10-CM | POA: Diagnosis not present

## 2024-05-09 DIAGNOSIS — Z9181 History of falling: Secondary | ICD-10-CM | POA: Diagnosis not present

## 2024-05-09 DIAGNOSIS — N3000 Acute cystitis without hematuria: Secondary | ICD-10-CM | POA: Diagnosis not present

## 2024-05-09 DIAGNOSIS — Z7984 Long term (current) use of oral hypoglycemic drugs: Secondary | ICD-10-CM | POA: Diagnosis not present

## 2024-05-09 DIAGNOSIS — F330 Major depressive disorder, recurrent, mild: Secondary | ICD-10-CM | POA: Diagnosis not present

## 2024-05-09 DIAGNOSIS — Z8673 Personal history of transient ischemic attack (TIA), and cerebral infarction without residual deficits: Secondary | ICD-10-CM | POA: Diagnosis not present

## 2024-05-09 DIAGNOSIS — E1121 Type 2 diabetes mellitus with diabetic nephropathy: Secondary | ICD-10-CM | POA: Diagnosis not present

## 2024-05-09 DIAGNOSIS — R338 Other retention of urine: Secondary | ICD-10-CM | POA: Diagnosis not present

## 2024-05-09 DIAGNOSIS — Z6829 Body mass index (BMI) 29.0-29.9, adult: Secondary | ICD-10-CM | POA: Diagnosis not present

## 2024-05-09 DIAGNOSIS — Z466 Encounter for fitting and adjustment of urinary device: Secondary | ICD-10-CM | POA: Diagnosis not present

## 2024-05-09 DIAGNOSIS — N138 Other obstructive and reflux uropathy: Secondary | ICD-10-CM | POA: Diagnosis not present

## 2024-05-09 DIAGNOSIS — B9620 Unspecified Escherichia coli [E. coli] as the cause of diseases classified elsewhere: Secondary | ICD-10-CM | POA: Diagnosis not present

## 2024-05-09 DIAGNOSIS — E785 Hyperlipidemia, unspecified: Secondary | ICD-10-CM | POA: Diagnosis not present

## 2024-05-09 DIAGNOSIS — K5900 Constipation, unspecified: Secondary | ICD-10-CM | POA: Diagnosis not present

## 2024-05-09 DIAGNOSIS — Z7982 Long term (current) use of aspirin: Secondary | ICD-10-CM | POA: Diagnosis not present

## 2024-05-09 DIAGNOSIS — Z556 Problems related to health literacy: Secondary | ICD-10-CM | POA: Diagnosis not present

## 2024-05-09 DIAGNOSIS — N401 Enlarged prostate with lower urinary tract symptoms: Secondary | ICD-10-CM | POA: Diagnosis not present

## 2024-05-09 DIAGNOSIS — I10 Essential (primary) hypertension: Secondary | ICD-10-CM | POA: Diagnosis not present

## 2024-05-09 DIAGNOSIS — E871 Hypo-osmolality and hyponatremia: Secondary | ICD-10-CM | POA: Diagnosis not present

## 2024-05-09 DIAGNOSIS — E1143 Type 2 diabetes mellitus with diabetic autonomic (poly)neuropathy: Secondary | ICD-10-CM | POA: Diagnosis not present

## 2024-05-15 DIAGNOSIS — E119 Type 2 diabetes mellitus without complications: Secondary | ICD-10-CM | POA: Diagnosis not present

## 2024-05-28 ENCOUNTER — Ambulatory Visit: Payer: Self-pay | Admitting: Urology

## 2024-05-28 ENCOUNTER — Encounter: Payer: Self-pay | Admitting: Urology

## 2024-05-28 VITALS — BP 121/56 | HR 59

## 2024-05-28 DIAGNOSIS — N3289 Other specified disorders of bladder: Secondary | ICD-10-CM | POA: Diagnosis not present

## 2024-05-28 MED ORDER — SILODOSIN 8 MG PO CAPS
8.0000 mg | ORAL_CAPSULE | Freq: Every day | ORAL | 11 refills | Status: DC
Start: 2024-05-28 — End: 2024-05-29

## 2024-05-28 MED ORDER — CIPROFLOXACIN HCL 500 MG PO TABS
500.0000 mg | ORAL_TABLET | Freq: Once | ORAL | Status: AC
  Administered 2024-05-28: 500 mg via ORAL

## 2024-05-28 NOTE — Patient Instructions (Signed)

## 2024-05-28 NOTE — Progress Notes (Unsigned)
   05/28/24  CC: bladder mass and urinary retention   HPI: Mr Paige is a 83yo here for cystoscopy for difficulty urinating and bladder mass Blood pressure (!) 121/56, pulse (!) 59. NED. A&Ox3.   No respiratory distress   Abd soft, NT, ND Normal phallus with bilateral descended testicles  Cystoscopy Procedure Note  Patient identification was confirmed, informed consent was obtained, and patient was prepped using Betadine solution.  Lidocaine jelly was administered per urethral meatus.     Pre-Procedure: - Inspection reveals a normal caliber ureteral meatus.  Procedure: The flexible cystoscope was introduced without difficulty - No urethral strictures/lesions are present. - Enlarged prostate no median lobe - Normal bladder neck - Bilateral ureteral orifices identified - Bladder mucosa  reveals no ulcers, tumors, or lesions - No bladder stones - No trabeculation     Post-Procedure: - Patient tolerated the procedure well  Assessment/ Plan: No bladder mas sfound on office cystoscopy. We will start rapaflo  8mg  daily and stop terazosin . Followup 1-2 weeks for a voiding trial and then 3-6 months with a PVR if he passes his voiding trial  No follow-ups on file.  Belvie Clara, MD

## 2024-05-28 NOTE — Progress Notes (Unsigned)
   Cath Change/ Replacement  Patient is present today for a catheter change due to urinary retention.  10ml of water was removed from the balloon, a 16FR foley cath was removed without difficulty.  Patient was cleaned and prepped in a sterile fashion with Betadinex3.  A 16 FR foley cath was replaced into the bladder, no complications were noted. Urine return was noted and urine was Dark yellow in color. The balloon was filled with 10ml of sterile water. A leg bag was attached for drainage.  A night bag was also given to the patient and patient was given instruction on how to change from one bag to another. Patient was given proper instruction on catheter care.    Performed by: Exie DASEN. CMA  Follow up: as scheduled

## 2024-05-29 ENCOUNTER — Telehealth: Payer: Self-pay | Admitting: Urology

## 2024-05-29 ENCOUNTER — Other Ambulatory Visit: Payer: Self-pay

## 2024-05-29 LAB — URINALYSIS, ROUTINE W REFLEX MICROSCOPIC
Bilirubin, UA: NEGATIVE
Ketones, UA: NEGATIVE
Nitrite, UA: POSITIVE — AB
Protein,UA: NEGATIVE
Specific Gravity, UA: 1.015 (ref 1.005–1.030)
Urobilinogen, Ur: 0.2 mg/dL (ref 0.2–1.0)
pH, UA: 6 (ref 5.0–7.5)

## 2024-05-29 LAB — MICROSCOPIC EXAMINATION

## 2024-05-29 MED ORDER — SILODOSIN 8 MG PO CAPS: 8.0000 mg | ORAL_CAPSULE | Freq: Every day | ORAL | 11 refills | Status: AC

## 2024-05-29 NOTE — Telephone Encounter (Signed)
 Medication was sent to wrong pharmacy 05/28/24   Medication needs to be sent to [facility] in [place] not [facility] in [place]  Please resend the silodosin  silodosin  (RAPAFLO ) 8 MG CAPS capsule

## 2024-05-29 NOTE — Telephone Encounter (Signed)
 updated

## 2024-06-07 ENCOUNTER — Ambulatory Visit

## 2024-06-07 DIAGNOSIS — R339 Retention of urine, unspecified: Secondary | ICD-10-CM | POA: Diagnosis not present

## 2024-06-07 MED ORDER — CIPROFLOXACIN HCL 500 MG PO TABS
500.0000 mg | ORAL_TABLET | Freq: Once | ORAL | Status: AC
  Administered 2024-06-07: 500 mg via ORAL

## 2024-06-07 NOTE — Progress Notes (Addendum)
 Fill and Pull Catheter Removal  Patient is present today for a catheter removal.  200 ml of sterile water was instilled into the bladder when the patient felt the urge to urinate. 10ml of water was then drained from the balloon.  A 16FR foley cath was removed from the bladder no complications were noted .  Foley catheter intact and time of removal. Patient as then given some time to void on their own.  Patient cannot void on their own after some time.  Patient tolerated well.  One oral prophylactic antibiotic given per MD orders  Performed by: Zaden Sako LPN  Follow up/ Additional notes: Patient felt uncomfortable and request foley catheter be reinserted.   Simple Catheter Placement  Due to urinary retention patient is present today for a foley cath placement.  Patient was cleaned and prepped in a sterile fashion with Betadinex3 . A 16 FR foley catheter was inserted, urine return was noted  220 ml, urine was Clear yellow in color.  The balloon was filled with 10cc of sterile water.  A leg bag was attached for drainage. Patient was also given a night bag to take home and was given instruction on how to change from one bag to another.  Patient was given instruction on proper catheter care.  Patient tolerated well, no complications were noted   Performed by: Shelita Steptoe LPN  Additional notes/ Follow up:  pending MD recommendations.

## 2024-06-08 ENCOUNTER — Telehealth: Payer: Self-pay

## 2024-06-08 NOTE — Telephone Encounter (Signed)
 Patient called with no answer. Detailed message left stating Md wanted to discuss possible surgery. Appointment offered with request to call back to confirm.

## 2024-06-13 ENCOUNTER — Encounter: Payer: Self-pay | Admitting: Urology

## 2024-06-13 ENCOUNTER — Ambulatory Visit: Admitting: Urology

## 2024-06-13 DIAGNOSIS — N401 Enlarged prostate with lower urinary tract symptoms: Secondary | ICD-10-CM | POA: Diagnosis not present

## 2024-06-13 DIAGNOSIS — R339 Retention of urine, unspecified: Secondary | ICD-10-CM

## 2024-06-13 DIAGNOSIS — R338 Other retention of urine: Secondary | ICD-10-CM | POA: Diagnosis not present

## 2024-06-13 NOTE — Patient Instructions (Signed)

## 2024-06-13 NOTE — Progress Notes (Signed)
 06/13/2024 3:21 PM   Omar Porter Sep 13, 1941 [id number]  Referring provider: Radiontchenko, Alexei, MD [address] [place],  [place] [address]  Followup BPH   HPI: Omar Porter is a 83yo here for followup for BPH with urinary retention. He has failed multiple voiding trials. Cystoscopy revealed bilobar hyperplasia and no median lobe. He has failed his last voiding trial while on rapaflo  8mg .    PMH: Past Medical History:  Diagnosis Date   Allergic rhinitis    Anxiety    Arthritis    Blurred vision    BPH (benign prostatic hyperplasia)    Chronic back pain    Dermatitis    Diabetes mellitus    Diverticulitis    Hyperlipidemia    Hypertension    Hyposomnia, insomnia, or sleeplessness associated with anxiety    Leg pain    Lumbar stress fracture    Severe headache    Syncopal episodes    with vision change    Varicose veins of legs    right     Surgical History: Past Surgical History:  Procedure Laterality Date   None      Home Medications:  Allergies as of 06/13/2024       Reactions   Sulfa Antibiotics Diarrhea        Medication List        Accurate as of June 13, 2024  3:21 PM. If you have any questions, ask your nurse or doctor.          [facility] Redi-Code test strip Generic drug: glucose blood 3 (three) times daily.   amLODipine  10 MG tablet Commonly known as: NORVASC  Take 1 tablet (10 mg total) by mouth daily.   aspirin 81 MG chewable tablet Chew 81 mg by mouth daily.   atorvastatin  40 MG tablet Commonly known as: LIPITOR Take 40 mg by mouth daily.   cyclobenzaprine  5 MG tablet Commonly known as: FLEXERIL  Take 1 tablet (5 mg total) by mouth 3 (three) times daily as needed for muscle spasms.   desoximetasone 0.25 % cream Commonly known as: TOPICORT Apply 1 application topically 2 (two) times daily.   Difluprednate 0.05 % Emul 1 drop 2 (two) times daily.   finasteride 5 MG tablet Commonly known as: PROSCAR Take 5 mg by mouth  daily.   furosemide 20 MG tablet Commonly known as: LASIX Take 20 mg by mouth daily.   glimepiride  4 MG tablet Commonly known as: AMARYL  Take 4 mg by mouth 2 (two) times daily.   hydrocortisone 2.5 % rectal cream Commonly known as: ANUSOL-HC 1 application 2 (two) times daily.   Lancets Misc by Does not apply route as needed.   lisinopril  20 MG tablet Commonly known as: ZESTRIL  Take 1 tablet (20 mg total) by mouth daily.   lubiprostone 24 MCG capsule Commonly known as: AMITIZA Take 24 mcg by mouth 2 (two) times daily with a meal.   metoprolol  tartrate 25 MG tablet Commonly known as: LOPRESSOR  TAKE 1 TABLET (25 MG TOTAL) BY MOUTH 2 (TWO) TIMES DAILY.   montelukast 10 MG tablet Commonly known as: SINGULAIR TAKE 1 TABLET BY MOUTH AT BEDTIME   ofloxacin 0.3 % ophthalmic solution Commonly known as: OCUFLOX 1 drop daily.   oxyCODONE -acetaminophen  7.5-325 MG tablet Commonly known as: PERCOCET Take 1 tablet by mouth every 8 (eight) hours as needed for pain.   silodosin  8 MG Caps capsule Commonly known as: RAPAFLO  Take 1 capsule (8 mg total) by mouth at bedtime.   terazosin   10 MG capsule Commonly known as: HYTRIN  TAKE 1 CAPSULE TWICE A DAY        Allergies:  Allergies  Allergen Reactions   Sulfa Antibiotics Diarrhea    Family History: Family History  Problem Relation Age of Onset   CAD Father     Social History:  reports that he quit smoking about 51 years ago. His smoking use included cigarettes. He started smoking about 71 years ago. He has a 30 pack-year smoking history. He has never used smokeless tobacco. He reports that he does not drink alcohol and does not use drugs.  ROS: All other review of systems were reviewed and are negative except what is noted above in HPI  Physical Exam: There were no vitals taken for this visit.  Constitutional:  Alert and oriented, No acute distress. HEENT: [place] AT, moist mucus membranes.  Trachea midline, no  masses. Cardiovascular: No clubbing, cyanosis, or edema. Respiratory: Normal respiratory effort, no increased work of breathing. GI: Abdomen is soft, nontender, nondistended, no abdominal masses GU: No CVA tenderness.  Lymph: No cervical or inguinal lymphadenopathy. Skin: No rashes, bruises or suspicious lesions. Neurologic: Grossly intact, no focal deficits, moving all 4 extremities. Psychiatric: Normal mood and affect.  Laboratory Data: Lab Results  Component Value Date   WBC 8.0 09/03/2016   HGB 15.4 09/03/2016   HCT 43.7 09/03/2016   MCV 87.9 09/03/2016   PLT 203 09/03/2016    Lab Results  Component Value Date   CREATININE 0.95 09/03/2016    No results found for: PSA  No results found for: TESTOSTERONE  No results found for: HGBA1C  Urinalysis    Component Value Date/Time   COLORURINE YELLOW 07/04/2015 1346   APPEARANCEUR Clear 05/28/2024 1603   LABSPEC >1.030 (H) 07/04/2015 1346   PHURINE 5.5 07/04/2015 1346   GLUCOSEU 3+ (A) 05/28/2024 1603   HGBUR LARGE (A) 07/04/2015 1346   BILIRUBINUR Negative 05/28/2024 1603   KETONESUR 15 (A) 07/04/2015 1346   PROTEINUR Negative 05/28/2024 1603   PROTEINUR 100 (A) 07/04/2015 1346   UROBILINOGEN 0.2 07/04/2015 1346   NITRITE Positive (A) 05/28/2024 1603   NITRITE NEGATIVE 07/04/2015 1346   LEUKOCYTESUR 2+ (A) 05/28/2024 1603    Lab Results  Component Value Date   LABMICR See below: 05/28/2024   WBCUA 11-30 (A) 05/28/2024   LABEPIT 0-10 05/28/2024   BACTERIA Moderate (A) 05/28/2024    Pertinent Imaging:  No results found for this or any previous visit.  No results found for this or any previous visit.  No results found for this or any previous visit.  No results found for this or any previous visit.  No results found for this or any previous visit.  No results found for this or any previous visit.  No results found for this or any previous visit.  No results found for this or any previous  visit.   Assessment & Plan:    1. Urinary retention (Primary) Continue monthly foley changes  2. Benign non-nodular prostatic hyperplasia with lower urinary tract symptoms We discussed the management of his BPH including continued medical therapy, Rezum, Urolift, TURP and simple prostatectomy. After discussing the options the patient has elected to proceed with chronic indwelling foley.    No follow-ups on file.  Belvie Clara, MD  [facility] [place]

## 2024-06-15 DIAGNOSIS — E119 Type 2 diabetes mellitus without complications: Secondary | ICD-10-CM | POA: Diagnosis not present

## 2024-06-27 DIAGNOSIS — E1122 Type 2 diabetes mellitus with diabetic chronic kidney disease: Secondary | ICD-10-CM | POA: Diagnosis not present

## 2024-06-27 DIAGNOSIS — N182 Chronic kidney disease, stage 2 (mild): Secondary | ICD-10-CM | POA: Diagnosis not present

## 2024-06-27 DIAGNOSIS — E871 Hypo-osmolality and hyponatremia: Secondary | ICD-10-CM | POA: Diagnosis not present

## 2024-06-27 DIAGNOSIS — A4189 Other specified sepsis: Secondary | ICD-10-CM | POA: Diagnosis not present

## 2024-06-27 DIAGNOSIS — I5033 Acute on chronic diastolic (congestive) heart failure: Secondary | ICD-10-CM | POA: Diagnosis not present

## 2024-06-28 ENCOUNTER — Ambulatory Visit

## 2024-07-12 ENCOUNTER — Ambulatory Visit

## 2024-07-12 ENCOUNTER — Telehealth: Payer: Self-pay

## 2024-07-12 DIAGNOSIS — Z79891 Long term (current) use of opiate analgesic: Secondary | ICD-10-CM | POA: Diagnosis not present

## 2024-07-12 DIAGNOSIS — E871 Hypo-osmolality and hyponatremia: Secondary | ICD-10-CM | POA: Diagnosis not present

## 2024-07-12 DIAGNOSIS — I130 Hypertensive heart and chronic kidney disease with heart failure and stage 1 through stage 4 chronic kidney disease, or unspecified chronic kidney disease: Secondary | ICD-10-CM | POA: Diagnosis not present

## 2024-07-12 DIAGNOSIS — F32A Depression, unspecified: Secondary | ICD-10-CM | POA: Diagnosis not present

## 2024-07-12 DIAGNOSIS — Z556 Problems related to health literacy: Secondary | ICD-10-CM | POA: Diagnosis not present

## 2024-07-12 DIAGNOSIS — N138 Other obstructive and reflux uropathy: Secondary | ICD-10-CM | POA: Diagnosis not present

## 2024-07-12 DIAGNOSIS — I5042 Chronic combined systolic (congestive) and diastolic (congestive) heart failure: Secondary | ICD-10-CM | POA: Diagnosis not present

## 2024-07-12 DIAGNOSIS — Z6829 Body mass index (BMI) 29.0-29.9, adult: Secondary | ICD-10-CM | POA: Diagnosis not present

## 2024-07-12 DIAGNOSIS — G4700 Insomnia, unspecified: Secondary | ICD-10-CM | POA: Diagnosis not present

## 2024-07-12 DIAGNOSIS — R338 Other retention of urine: Secondary | ICD-10-CM | POA: Diagnosis not present

## 2024-07-12 DIAGNOSIS — Z7984 Long term (current) use of oral hypoglycemic drugs: Secondary | ICD-10-CM | POA: Diagnosis not present

## 2024-07-12 DIAGNOSIS — N182 Chronic kidney disease, stage 2 (mild): Secondary | ICD-10-CM | POA: Diagnosis not present

## 2024-07-12 DIAGNOSIS — N401 Enlarged prostate with lower urinary tract symptoms: Secondary | ICD-10-CM | POA: Diagnosis not present

## 2024-07-12 DIAGNOSIS — E1122 Type 2 diabetes mellitus with diabetic chronic kidney disease: Secondary | ICD-10-CM | POA: Diagnosis not present

## 2024-07-12 DIAGNOSIS — Z466 Encounter for fitting and adjustment of urinary device: Secondary | ICD-10-CM | POA: Diagnosis not present

## 2024-07-12 DIAGNOSIS — E1140 Type 2 diabetes mellitus with diabetic neuropathy, unspecified: Secondary | ICD-10-CM | POA: Diagnosis not present

## 2024-07-12 DIAGNOSIS — A4189 Other specified sepsis: Secondary | ICD-10-CM | POA: Diagnosis not present

## 2024-07-12 NOTE — Telephone Encounter (Signed)
 Clinical manager called to get verbal order for pt cath change per last ov note pt is to receive monthly cath changes and last known size of cath is a 75 Stage manager voiced his understanding

## 2024-07-15 DIAGNOSIS — R339 Retention of urine, unspecified: Secondary | ICD-10-CM | POA: Diagnosis not present

## 2024-07-16 DIAGNOSIS — E119 Type 2 diabetes mellitus without complications: Secondary | ICD-10-CM | POA: Diagnosis not present

## 2024-07-24 DIAGNOSIS — Z79891 Long term (current) use of opiate analgesic: Secondary | ICD-10-CM | POA: Diagnosis not present

## 2024-07-24 DIAGNOSIS — Z6829 Body mass index (BMI) 29.0-29.9, adult: Secondary | ICD-10-CM | POA: Diagnosis not present

## 2024-07-24 DIAGNOSIS — I5042 Chronic combined systolic (congestive) and diastolic (congestive) heart failure: Secondary | ICD-10-CM | POA: Diagnosis not present

## 2024-07-24 DIAGNOSIS — I130 Hypertensive heart and chronic kidney disease with heart failure and stage 1 through stage 4 chronic kidney disease, or unspecified chronic kidney disease: Secondary | ICD-10-CM | POA: Diagnosis not present

## 2024-07-24 DIAGNOSIS — E871 Hypo-osmolality and hyponatremia: Secondary | ICD-10-CM | POA: Diagnosis not present

## 2024-07-24 DIAGNOSIS — A4189 Other specified sepsis: Secondary | ICD-10-CM | POA: Diagnosis not present

## 2024-07-24 DIAGNOSIS — E1140 Type 2 diabetes mellitus with diabetic neuropathy, unspecified: Secondary | ICD-10-CM | POA: Diagnosis not present

## 2024-07-24 DIAGNOSIS — Z556 Problems related to health literacy: Secondary | ICD-10-CM | POA: Diagnosis not present

## 2024-07-24 DIAGNOSIS — G4700 Insomnia, unspecified: Secondary | ICD-10-CM | POA: Diagnosis not present

## 2024-07-24 DIAGNOSIS — N138 Other obstructive and reflux uropathy: Secondary | ICD-10-CM | POA: Diagnosis not present

## 2024-07-24 DIAGNOSIS — Z466 Encounter for fitting and adjustment of urinary device: Secondary | ICD-10-CM | POA: Diagnosis not present

## 2024-07-24 DIAGNOSIS — R338 Other retention of urine: Secondary | ICD-10-CM | POA: Diagnosis not present

## 2024-07-24 DIAGNOSIS — F32A Depression, unspecified: Secondary | ICD-10-CM | POA: Diagnosis not present

## 2024-07-24 DIAGNOSIS — N182 Chronic kidney disease, stage 2 (mild): Secondary | ICD-10-CM | POA: Diagnosis not present

## 2024-07-24 DIAGNOSIS — E1122 Type 2 diabetes mellitus with diabetic chronic kidney disease: Secondary | ICD-10-CM | POA: Diagnosis not present

## 2024-07-24 DIAGNOSIS — Z7984 Long term (current) use of oral hypoglycemic drugs: Secondary | ICD-10-CM | POA: Diagnosis not present

## 2024-07-24 DIAGNOSIS — N401 Enlarged prostate with lower urinary tract symptoms: Secondary | ICD-10-CM | POA: Diagnosis not present

## 2024-08-10 DIAGNOSIS — F32A Depression, unspecified: Secondary | ICD-10-CM | POA: Diagnosis not present

## 2024-08-10 DIAGNOSIS — E871 Hypo-osmolality and hyponatremia: Secondary | ICD-10-CM | POA: Diagnosis not present

## 2024-08-10 DIAGNOSIS — Z466 Encounter for fitting and adjustment of urinary device: Secondary | ICD-10-CM | POA: Diagnosis not present

## 2024-08-10 DIAGNOSIS — Z79891 Long term (current) use of opiate analgesic: Secondary | ICD-10-CM | POA: Diagnosis not present

## 2024-08-10 DIAGNOSIS — Z7984 Long term (current) use of oral hypoglycemic drugs: Secondary | ICD-10-CM | POA: Diagnosis not present

## 2024-08-10 DIAGNOSIS — N401 Enlarged prostate with lower urinary tract symptoms: Secondary | ICD-10-CM | POA: Diagnosis not present

## 2024-08-10 DIAGNOSIS — E1122 Type 2 diabetes mellitus with diabetic chronic kidney disease: Secondary | ICD-10-CM | POA: Diagnosis not present

## 2024-08-10 DIAGNOSIS — E1140 Type 2 diabetes mellitus with diabetic neuropathy, unspecified: Secondary | ICD-10-CM | POA: Diagnosis not present

## 2024-08-10 DIAGNOSIS — R338 Other retention of urine: Secondary | ICD-10-CM | POA: Diagnosis not present

## 2024-08-10 DIAGNOSIS — Z556 Problems related to health literacy: Secondary | ICD-10-CM | POA: Diagnosis not present

## 2024-08-10 DIAGNOSIS — I5042 Chronic combined systolic (congestive) and diastolic (congestive) heart failure: Secondary | ICD-10-CM | POA: Diagnosis not present

## 2024-08-10 DIAGNOSIS — A4189 Other specified sepsis: Secondary | ICD-10-CM | POA: Diagnosis not present

## 2024-08-10 DIAGNOSIS — N182 Chronic kidney disease, stage 2 (mild): Secondary | ICD-10-CM | POA: Diagnosis not present

## 2024-08-10 DIAGNOSIS — N138 Other obstructive and reflux uropathy: Secondary | ICD-10-CM | POA: Diagnosis not present

## 2024-08-10 DIAGNOSIS — Z6829 Body mass index (BMI) 29.0-29.9, adult: Secondary | ICD-10-CM | POA: Diagnosis not present

## 2024-08-10 DIAGNOSIS — G4700 Insomnia, unspecified: Secondary | ICD-10-CM | POA: Diagnosis not present

## 2024-08-10 DIAGNOSIS — I130 Hypertensive heart and chronic kidney disease with heart failure and stage 1 through stage 4 chronic kidney disease, or unspecified chronic kidney disease: Secondary | ICD-10-CM | POA: Diagnosis not present

## 2024-08-11 DIAGNOSIS — Z79891 Long term (current) use of opiate analgesic: Secondary | ICD-10-CM | POA: Diagnosis not present

## 2024-08-11 DIAGNOSIS — I5042 Chronic combined systolic (congestive) and diastolic (congestive) heart failure: Secondary | ICD-10-CM | POA: Diagnosis not present

## 2024-08-11 DIAGNOSIS — F32A Depression, unspecified: Secondary | ICD-10-CM | POA: Diagnosis not present

## 2024-08-11 DIAGNOSIS — Z556 Problems related to health literacy: Secondary | ICD-10-CM | POA: Diagnosis not present

## 2024-08-11 DIAGNOSIS — E871 Hypo-osmolality and hyponatremia: Secondary | ICD-10-CM | POA: Diagnosis not present

## 2024-08-11 DIAGNOSIS — Z7984 Long term (current) use of oral hypoglycemic drugs: Secondary | ICD-10-CM | POA: Diagnosis not present

## 2024-08-11 DIAGNOSIS — R338 Other retention of urine: Secondary | ICD-10-CM | POA: Diagnosis not present

## 2024-08-11 DIAGNOSIS — A4189 Other specified sepsis: Secondary | ICD-10-CM | POA: Diagnosis not present

## 2024-08-11 DIAGNOSIS — N138 Other obstructive and reflux uropathy: Secondary | ICD-10-CM | POA: Diagnosis not present

## 2024-08-11 DIAGNOSIS — N182 Chronic kidney disease, stage 2 (mild): Secondary | ICD-10-CM | POA: Diagnosis not present

## 2024-08-11 DIAGNOSIS — I130 Hypertensive heart and chronic kidney disease with heart failure and stage 1 through stage 4 chronic kidney disease, or unspecified chronic kidney disease: Secondary | ICD-10-CM | POA: Diagnosis not present

## 2024-08-11 DIAGNOSIS — Z466 Encounter for fitting and adjustment of urinary device: Secondary | ICD-10-CM | POA: Diagnosis not present

## 2024-08-11 DIAGNOSIS — E1122 Type 2 diabetes mellitus with diabetic chronic kidney disease: Secondary | ICD-10-CM | POA: Diagnosis not present

## 2024-08-11 DIAGNOSIS — G4700 Insomnia, unspecified: Secondary | ICD-10-CM | POA: Diagnosis not present

## 2024-08-11 DIAGNOSIS — E1140 Type 2 diabetes mellitus with diabetic neuropathy, unspecified: Secondary | ICD-10-CM | POA: Diagnosis not present

## 2024-08-11 DIAGNOSIS — N401 Enlarged prostate with lower urinary tract symptoms: Secondary | ICD-10-CM | POA: Diagnosis not present

## 2024-08-11 DIAGNOSIS — Z6829 Body mass index (BMI) 29.0-29.9, adult: Secondary | ICD-10-CM | POA: Diagnosis not present

## 2024-08-14 DIAGNOSIS — R339 Retention of urine, unspecified: Secondary | ICD-10-CM | POA: Diagnosis not present

## 2024-08-15 DIAGNOSIS — E119 Type 2 diabetes mellitus without complications: Secondary | ICD-10-CM | POA: Diagnosis not present

## 2024-08-22 DIAGNOSIS — N401 Enlarged prostate with lower urinary tract symptoms: Secondary | ICD-10-CM | POA: Diagnosis not present

## 2024-08-22 DIAGNOSIS — Z556 Problems related to health literacy: Secondary | ICD-10-CM | POA: Diagnosis not present

## 2024-08-22 DIAGNOSIS — I5042 Chronic combined systolic (congestive) and diastolic (congestive) heart failure: Secondary | ICD-10-CM | POA: Diagnosis not present

## 2024-08-22 DIAGNOSIS — I130 Hypertensive heart and chronic kidney disease with heart failure and stage 1 through stage 4 chronic kidney disease, or unspecified chronic kidney disease: Secondary | ICD-10-CM | POA: Diagnosis not present

## 2024-08-22 DIAGNOSIS — E871 Hypo-osmolality and hyponatremia: Secondary | ICD-10-CM | POA: Diagnosis not present

## 2024-08-22 DIAGNOSIS — N138 Other obstructive and reflux uropathy: Secondary | ICD-10-CM | POA: Diagnosis not present

## 2024-08-22 DIAGNOSIS — E1122 Type 2 diabetes mellitus with diabetic chronic kidney disease: Secondary | ICD-10-CM | POA: Diagnosis not present

## 2024-08-22 DIAGNOSIS — A4189 Other specified sepsis: Secondary | ICD-10-CM | POA: Diagnosis not present

## 2024-08-22 DIAGNOSIS — F32A Depression, unspecified: Secondary | ICD-10-CM | POA: Diagnosis not present

## 2024-08-22 DIAGNOSIS — Z79891 Long term (current) use of opiate analgesic: Secondary | ICD-10-CM | POA: Diagnosis not present

## 2024-08-22 DIAGNOSIS — Z7984 Long term (current) use of oral hypoglycemic drugs: Secondary | ICD-10-CM | POA: Diagnosis not present

## 2024-08-22 DIAGNOSIS — E1140 Type 2 diabetes mellitus with diabetic neuropathy, unspecified: Secondary | ICD-10-CM | POA: Diagnosis not present

## 2024-08-22 DIAGNOSIS — N182 Chronic kidney disease, stage 2 (mild): Secondary | ICD-10-CM | POA: Diagnosis not present

## 2024-08-22 DIAGNOSIS — R338 Other retention of urine: Secondary | ICD-10-CM | POA: Diagnosis not present

## 2024-08-22 DIAGNOSIS — Z466 Encounter for fitting and adjustment of urinary device: Secondary | ICD-10-CM | POA: Diagnosis not present

## 2024-08-22 DIAGNOSIS — G4700 Insomnia, unspecified: Secondary | ICD-10-CM | POA: Diagnosis not present

## 2024-08-22 DIAGNOSIS — Z6829 Body mass index (BMI) 29.0-29.9, adult: Secondary | ICD-10-CM | POA: Diagnosis not present

## 2024-09-07 DIAGNOSIS — I5042 Chronic combined systolic (congestive) and diastolic (congestive) heart failure: Secondary | ICD-10-CM | POA: Diagnosis not present

## 2024-09-07 DIAGNOSIS — A4189 Other specified sepsis: Secondary | ICD-10-CM | POA: Diagnosis not present

## 2024-09-07 DIAGNOSIS — N182 Chronic kidney disease, stage 2 (mild): Secondary | ICD-10-CM | POA: Diagnosis not present

## 2024-09-07 DIAGNOSIS — G4700 Insomnia, unspecified: Secondary | ICD-10-CM | POA: Diagnosis not present

## 2024-09-07 DIAGNOSIS — R338 Other retention of urine: Secondary | ICD-10-CM | POA: Diagnosis not present

## 2024-09-07 DIAGNOSIS — E871 Hypo-osmolality and hyponatremia: Secondary | ICD-10-CM | POA: Diagnosis not present

## 2024-09-07 DIAGNOSIS — E1122 Type 2 diabetes mellitus with diabetic chronic kidney disease: Secondary | ICD-10-CM | POA: Diagnosis not present

## 2024-09-07 DIAGNOSIS — Z556 Problems related to health literacy: Secondary | ICD-10-CM | POA: Diagnosis not present

## 2024-09-07 DIAGNOSIS — Z7984 Long term (current) use of oral hypoglycemic drugs: Secondary | ICD-10-CM | POA: Diagnosis not present

## 2024-09-07 DIAGNOSIS — I130 Hypertensive heart and chronic kidney disease with heart failure and stage 1 through stage 4 chronic kidney disease, or unspecified chronic kidney disease: Secondary | ICD-10-CM | POA: Diagnosis not present

## 2024-09-07 DIAGNOSIS — N138 Other obstructive and reflux uropathy: Secondary | ICD-10-CM | POA: Diagnosis not present

## 2024-09-07 DIAGNOSIS — Z79891 Long term (current) use of opiate analgesic: Secondary | ICD-10-CM | POA: Diagnosis not present

## 2024-09-07 DIAGNOSIS — Z6829 Body mass index (BMI) 29.0-29.9, adult: Secondary | ICD-10-CM | POA: Diagnosis not present

## 2024-09-07 DIAGNOSIS — F32A Depression, unspecified: Secondary | ICD-10-CM | POA: Diagnosis not present

## 2024-09-07 DIAGNOSIS — Z466 Encounter for fitting and adjustment of urinary device: Secondary | ICD-10-CM | POA: Diagnosis not present

## 2024-09-07 DIAGNOSIS — N401 Enlarged prostate with lower urinary tract symptoms: Secondary | ICD-10-CM | POA: Diagnosis not present

## 2024-09-07 DIAGNOSIS — E1140 Type 2 diabetes mellitus with diabetic neuropathy, unspecified: Secondary | ICD-10-CM | POA: Diagnosis not present

## 2024-09-10 DIAGNOSIS — N182 Chronic kidney disease, stage 2 (mild): Secondary | ICD-10-CM | POA: Diagnosis not present

## 2024-09-10 DIAGNOSIS — Z6829 Body mass index (BMI) 29.0-29.9, adult: Secondary | ICD-10-CM | POA: Diagnosis not present

## 2024-09-10 DIAGNOSIS — I130 Hypertensive heart and chronic kidney disease with heart failure and stage 1 through stage 4 chronic kidney disease, or unspecified chronic kidney disease: Secondary | ICD-10-CM | POA: Diagnosis not present

## 2024-09-10 DIAGNOSIS — R338 Other retention of urine: Secondary | ICD-10-CM | POA: Diagnosis not present

## 2024-09-10 DIAGNOSIS — Z79891 Long term (current) use of opiate analgesic: Secondary | ICD-10-CM | POA: Diagnosis not present

## 2024-09-10 DIAGNOSIS — I5042 Chronic combined systolic (congestive) and diastolic (congestive) heart failure: Secondary | ICD-10-CM | POA: Diagnosis not present

## 2024-09-10 DIAGNOSIS — F32A Depression, unspecified: Secondary | ICD-10-CM | POA: Diagnosis not present

## 2024-09-10 DIAGNOSIS — Z556 Problems related to health literacy: Secondary | ICD-10-CM | POA: Diagnosis not present

## 2024-09-10 DIAGNOSIS — Z466 Encounter for fitting and adjustment of urinary device: Secondary | ICD-10-CM | POA: Diagnosis not present

## 2024-09-10 DIAGNOSIS — Z7984 Long term (current) use of oral hypoglycemic drugs: Secondary | ICD-10-CM | POA: Diagnosis not present

## 2024-09-10 DIAGNOSIS — E1140 Type 2 diabetes mellitus with diabetic neuropathy, unspecified: Secondary | ICD-10-CM | POA: Diagnosis not present

## 2024-09-10 DIAGNOSIS — G4700 Insomnia, unspecified: Secondary | ICD-10-CM | POA: Diagnosis not present

## 2024-09-10 DIAGNOSIS — E1122 Type 2 diabetes mellitus with diabetic chronic kidney disease: Secondary | ICD-10-CM | POA: Diagnosis not present

## 2024-09-10 DIAGNOSIS — N401 Enlarged prostate with lower urinary tract symptoms: Secondary | ICD-10-CM | POA: Diagnosis not present

## 2024-09-10 DIAGNOSIS — N138 Other obstructive and reflux uropathy: Secondary | ICD-10-CM | POA: Diagnosis not present

## 2024-09-12 DIAGNOSIS — R339 Retention of urine, unspecified: Secondary | ICD-10-CM | POA: Diagnosis not present

## 2024-09-13 DIAGNOSIS — R339 Retention of urine, unspecified: Secondary | ICD-10-CM | POA: Diagnosis not present

## 2024-09-26 DIAGNOSIS — F32A Depression, unspecified: Secondary | ICD-10-CM | POA: Diagnosis not present

## 2024-09-26 DIAGNOSIS — Z7984 Long term (current) use of oral hypoglycemic drugs: Secondary | ICD-10-CM | POA: Diagnosis not present

## 2024-09-26 DIAGNOSIS — G4700 Insomnia, unspecified: Secondary | ICD-10-CM | POA: Diagnosis not present

## 2024-09-26 DIAGNOSIS — I130 Hypertensive heart and chronic kidney disease with heart failure and stage 1 through stage 4 chronic kidney disease, or unspecified chronic kidney disease: Secondary | ICD-10-CM | POA: Diagnosis not present

## 2024-09-26 DIAGNOSIS — Z466 Encounter for fitting and adjustment of urinary device: Secondary | ICD-10-CM | POA: Diagnosis not present

## 2024-09-26 DIAGNOSIS — E1122 Type 2 diabetes mellitus with diabetic chronic kidney disease: Secondary | ICD-10-CM | POA: Diagnosis not present

## 2024-09-26 DIAGNOSIS — Z79891 Long term (current) use of opiate analgesic: Secondary | ICD-10-CM | POA: Diagnosis not present

## 2024-09-26 DIAGNOSIS — R338 Other retention of urine: Secondary | ICD-10-CM | POA: Diagnosis not present

## 2024-09-26 DIAGNOSIS — Z556 Problems related to health literacy: Secondary | ICD-10-CM | POA: Diagnosis not present

## 2024-09-26 DIAGNOSIS — Z6829 Body mass index (BMI) 29.0-29.9, adult: Secondary | ICD-10-CM | POA: Diagnosis not present

## 2024-09-26 DIAGNOSIS — E1140 Type 2 diabetes mellitus with diabetic neuropathy, unspecified: Secondary | ICD-10-CM | POA: Diagnosis not present

## 2024-09-26 DIAGNOSIS — I5042 Chronic combined systolic (congestive) and diastolic (congestive) heart failure: Secondary | ICD-10-CM | POA: Diagnosis not present

## 2024-09-26 DIAGNOSIS — N138 Other obstructive and reflux uropathy: Secondary | ICD-10-CM | POA: Diagnosis not present

## 2024-09-26 DIAGNOSIS — N182 Chronic kidney disease, stage 2 (mild): Secondary | ICD-10-CM | POA: Diagnosis not present

## 2024-09-26 DIAGNOSIS — N401 Enlarged prostate with lower urinary tract symptoms: Secondary | ICD-10-CM | POA: Diagnosis not present

## 2024-10-08 DIAGNOSIS — Z466 Encounter for fitting and adjustment of urinary device: Secondary | ICD-10-CM | POA: Diagnosis not present

## 2024-10-08 DIAGNOSIS — N182 Chronic kidney disease, stage 2 (mild): Secondary | ICD-10-CM | POA: Diagnosis not present

## 2024-10-08 DIAGNOSIS — Z6829 Body mass index (BMI) 29.0-29.9, adult: Secondary | ICD-10-CM | POA: Diagnosis not present

## 2024-10-08 DIAGNOSIS — R338 Other retention of urine: Secondary | ICD-10-CM | POA: Diagnosis not present

## 2024-10-08 DIAGNOSIS — Z556 Problems related to health literacy: Secondary | ICD-10-CM | POA: Diagnosis not present

## 2024-10-08 DIAGNOSIS — Z79891 Long term (current) use of opiate analgesic: Secondary | ICD-10-CM | POA: Diagnosis not present

## 2024-10-08 DIAGNOSIS — N401 Enlarged prostate with lower urinary tract symptoms: Secondary | ICD-10-CM | POA: Diagnosis not present

## 2024-10-08 DIAGNOSIS — N138 Other obstructive and reflux uropathy: Secondary | ICD-10-CM | POA: Diagnosis not present

## 2024-10-08 DIAGNOSIS — G4700 Insomnia, unspecified: Secondary | ICD-10-CM | POA: Diagnosis not present

## 2024-10-08 DIAGNOSIS — E1140 Type 2 diabetes mellitus with diabetic neuropathy, unspecified: Secondary | ICD-10-CM | POA: Diagnosis not present

## 2024-10-08 DIAGNOSIS — F32A Depression, unspecified: Secondary | ICD-10-CM | POA: Diagnosis not present

## 2024-10-08 DIAGNOSIS — I130 Hypertensive heart and chronic kidney disease with heart failure and stage 1 through stage 4 chronic kidney disease, or unspecified chronic kidney disease: Secondary | ICD-10-CM | POA: Diagnosis not present

## 2024-10-08 DIAGNOSIS — E1122 Type 2 diabetes mellitus with diabetic chronic kidney disease: Secondary | ICD-10-CM | POA: Diagnosis not present

## 2024-10-08 DIAGNOSIS — I5042 Chronic combined systolic (congestive) and diastolic (congestive) heart failure: Secondary | ICD-10-CM | POA: Diagnosis not present

## 2024-10-08 DIAGNOSIS — Z7984 Long term (current) use of oral hypoglycemic drugs: Secondary | ICD-10-CM | POA: Diagnosis not present

## 2024-10-24 DIAGNOSIS — Z0000 Encounter for general adult medical examination without abnormal findings: Secondary | ICD-10-CM | POA: Diagnosis not present

## 2024-10-24 DIAGNOSIS — I5022 Chronic systolic (congestive) heart failure: Secondary | ICD-10-CM | POA: Diagnosis not present

## 2024-10-24 DIAGNOSIS — R339 Retention of urine, unspecified: Secondary | ICD-10-CM | POA: Diagnosis not present

## 2024-10-24 DIAGNOSIS — E1122 Type 2 diabetes mellitus with diabetic chronic kidney disease: Secondary | ICD-10-CM | POA: Diagnosis not present

## 2024-10-24 DIAGNOSIS — E871 Hypo-osmolality and hyponatremia: Secondary | ICD-10-CM | POA: Diagnosis not present
